# Patient Record
Sex: Female | Born: 1965 | Race: White | Hispanic: No | Marital: Married | Smoking: Never smoker
Health system: Southern US, Community
[De-identification: ages and names within clinical notes are randomized; demographics above are authoritative.]

## PROBLEM LIST (undated history)

## (undated) DIAGNOSIS — F4310 Post-traumatic stress disorder, unspecified: Secondary | ICD-10-CM

## (undated) DIAGNOSIS — D649 Anemia, unspecified: Secondary | ICD-10-CM

## (undated) DIAGNOSIS — I10 Essential (primary) hypertension: Secondary | ICD-10-CM

## (undated) DIAGNOSIS — M1990 Unspecified osteoarthritis, unspecified site: Secondary | ICD-10-CM

## (undated) DIAGNOSIS — F419 Anxiety disorder, unspecified: Secondary | ICD-10-CM

## (undated) DIAGNOSIS — J449 Chronic obstructive pulmonary disease, unspecified: Secondary | ICD-10-CM

## (undated) DIAGNOSIS — I739 Peripheral vascular disease, unspecified: Secondary | ICD-10-CM

## (undated) DIAGNOSIS — M549 Dorsalgia, unspecified: Secondary | ICD-10-CM

## (undated) DIAGNOSIS — K589 Irritable bowel syndrome without diarrhea: Secondary | ICD-10-CM

## (undated) DIAGNOSIS — F329 Major depressive disorder, single episode, unspecified: Secondary | ICD-10-CM

## (undated) DIAGNOSIS — F32A Depression, unspecified: Secondary | ICD-10-CM

## (undated) DIAGNOSIS — J45909 Unspecified asthma, uncomplicated: Secondary | ICD-10-CM

## (undated) DIAGNOSIS — R51 Headache: Secondary | ICD-10-CM

## (undated) DIAGNOSIS — Z87442 Personal history of urinary calculi: Secondary | ICD-10-CM

## (undated) DIAGNOSIS — K219 Gastro-esophageal reflux disease without esophagitis: Secondary | ICD-10-CM

## (undated) DIAGNOSIS — Z86718 Personal history of other venous thrombosis and embolism: Secondary | ICD-10-CM

## (undated) DIAGNOSIS — R519 Headache, unspecified: Secondary | ICD-10-CM

## (undated) DIAGNOSIS — Z8719 Personal history of other diseases of the digestive system: Secondary | ICD-10-CM

## (undated) HISTORY — PX: TONSILLECTOMY: SUR1361

## (undated) HISTORY — PX: CHOLECYSTECTOMY: SHX55

## (undated) HISTORY — PX: DILATION AND CURETTAGE OF UTERUS: SHX78

## (undated) HISTORY — DX: Personal history of other venous thrombosis and embolism: Z86.718

## (undated) HISTORY — DX: Post-traumatic stress disorder, unspecified: F43.10

## (undated) HISTORY — PX: NECK SURGERY: SHX720

---

## 1998-12-15 ENCOUNTER — Ambulatory Visit (HOSPITAL_COMMUNITY): Admission: RE | Admit: 1998-12-15 | Discharge: 1998-12-15 | Payer: Self-pay | Admitting: Obstetrics and Gynecology

## 2007-06-12 ENCOUNTER — Emergency Department: Payer: Self-pay | Admitting: Emergency Medicine

## 2008-01-15 ENCOUNTER — Ambulatory Visit: Payer: Self-pay | Admitting: Gastroenterology

## 2008-01-27 ENCOUNTER — Ambulatory Visit: Payer: Self-pay | Admitting: Gastroenterology

## 2009-03-26 ENCOUNTER — Emergency Department: Payer: Self-pay | Admitting: Emergency Medicine

## 2010-04-01 ENCOUNTER — Ambulatory Visit: Payer: Self-pay | Admitting: Obstetrics and Gynecology

## 2011-02-17 ENCOUNTER — Ambulatory Visit: Payer: Self-pay | Admitting: Obstetrics and Gynecology

## 2011-02-20 LAB — PATHOLOGY REPORT

## 2014-02-11 ENCOUNTER — Inpatient Hospital Stay (HOSPITAL_COMMUNITY): Payer: 59

## 2014-02-11 ENCOUNTER — Encounter (HOSPITAL_COMMUNITY): Payer: Self-pay | Admitting: Emergency Medicine

## 2014-02-11 ENCOUNTER — Emergency Department: Payer: Self-pay | Admitting: Emergency Medicine

## 2014-02-11 ENCOUNTER — Inpatient Hospital Stay (HOSPITAL_COMMUNITY)
Admission: EM | Admit: 2014-02-11 | Discharge: 2014-02-14 | DRG: 552 | Disposition: A | Discharge status: Home / Self Care | Payer: 59 | Attending: General Surgery | Admitting: General Surgery

## 2014-02-11 DIAGNOSIS — G894 Chronic pain syndrome: Secondary | ICD-10-CM | POA: Diagnosis present

## 2014-02-11 DIAGNOSIS — S060X9A Concussion with loss of consciousness of unspecified duration, initial encounter: Secondary | ICD-10-CM | POA: Diagnosis present

## 2014-02-11 DIAGNOSIS — F329 Major depressive disorder, single episode, unspecified: Secondary | ICD-10-CM | POA: Diagnosis present

## 2014-02-11 DIAGNOSIS — Z7982 Long term (current) use of aspirin: Secondary | ICD-10-CM

## 2014-02-11 DIAGNOSIS — R109 Unspecified abdominal pain: Secondary | ICD-10-CM | POA: Diagnosis present

## 2014-02-11 DIAGNOSIS — S129XXA Fracture of neck, unspecified, initial encounter: Secondary | ICD-10-CM

## 2014-02-11 DIAGNOSIS — Z1881 Retained glass fragments: Secondary | ICD-10-CM

## 2014-02-11 DIAGNOSIS — K5900 Constipation, unspecified: Secondary | ICD-10-CM | POA: Diagnosis present

## 2014-02-11 DIAGNOSIS — M545 Low back pain, unspecified: Secondary | ICD-10-CM | POA: Diagnosis present

## 2014-02-11 DIAGNOSIS — I10 Essential (primary) hypertension: Secondary | ICD-10-CM | POA: Diagnosis present

## 2014-02-11 DIAGNOSIS — J4489 Other specified chronic obstructive pulmonary disease: Secondary | ICD-10-CM | POA: Diagnosis present

## 2014-02-11 DIAGNOSIS — F3289 Other specified depressive episodes: Secondary | ICD-10-CM | POA: Diagnosis present

## 2014-02-11 DIAGNOSIS — D62 Acute posthemorrhagic anemia: Secondary | ICD-10-CM | POA: Diagnosis present

## 2014-02-11 DIAGNOSIS — R071 Chest pain on breathing: Secondary | ICD-10-CM | POA: Diagnosis present

## 2014-02-11 DIAGNOSIS — J449 Chronic obstructive pulmonary disease, unspecified: Secondary | ICD-10-CM | POA: Diagnosis present

## 2014-02-11 DIAGNOSIS — Z79899 Other long term (current) drug therapy: Secondary | ICD-10-CM

## 2014-02-11 DIAGNOSIS — S61209A Unspecified open wound of unspecified finger without damage to nail, initial encounter: Secondary | ICD-10-CM | POA: Diagnosis present

## 2014-02-11 DIAGNOSIS — G8911 Acute pain due to trauma: Secondary | ICD-10-CM

## 2014-02-11 DIAGNOSIS — G8929 Other chronic pain: Secondary | ICD-10-CM | POA: Diagnosis present

## 2014-02-11 DIAGNOSIS — S060XAA Concussion with loss of consciousness status unknown, initial encounter: Secondary | ICD-10-CM | POA: Diagnosis present

## 2014-02-11 DIAGNOSIS — Z86718 Personal history of other venous thrombosis and embolism: Secondary | ICD-10-CM

## 2014-02-11 DIAGNOSIS — S12000A Unspecified displaced fracture of first cervical vertebra, initial encounter for closed fracture: Principal | ICD-10-CM | POA: Diagnosis present

## 2014-02-11 HISTORY — DX: Major depressive disorder, single episode, unspecified: F32.9

## 2014-02-11 HISTORY — DX: Depression, unspecified: F32.A

## 2014-02-11 HISTORY — DX: Dorsalgia, unspecified: M54.9

## 2014-02-11 HISTORY — DX: Unspecified asthma, uncomplicated: J45.909

## 2014-02-11 HISTORY — DX: Essential (primary) hypertension: I10

## 2014-02-11 HISTORY — DX: Chronic obstructive pulmonary disease, unspecified: J44.9

## 2014-02-11 LAB — PROTIME-INR
INR: 1
PROTHROMBIN TIME: 13.1 s (ref 11.5–14.7)

## 2014-02-11 LAB — COMPREHENSIVE METABOLIC PANEL
Albumin: 3.3 g/dL — ABNORMAL LOW (ref 3.4–5.0)
Alkaline Phosphatase: 64 U/L
Anion Gap: 2 — ABNORMAL LOW (ref 7–16)
BUN: 10 mg/dL (ref 7–18)
Bilirubin,Total: 0.3 mg/dL (ref 0.2–1.0)
CHLORIDE: 107 mmol/L (ref 98–107)
CO2: 30 mmol/L (ref 21–32)
Calcium, Total: 8.7 mg/dL (ref 8.5–10.1)
Creatinine: 0.8 mg/dL (ref 0.60–1.30)
EGFR (African American): >60
Glucose: 91 mg/dL (ref 65–99)
Osmolality: 276 (ref 275–301)
POTASSIUM: 3.7 mmol/L (ref 3.5–5.1)
SGOT(AST): 33 U/L (ref 15–37)
SGPT (ALT): 25 U/L (ref 12–78)
Sodium: 139 mmol/L (ref 136–145)
Total Protein: 6.8 g/dL (ref 6.4–8.2)

## 2014-02-11 LAB — CBC
HCT: 37.6 % (ref 35.0–47.0)
HGB: 12.9 g/dL (ref 12.0–16.0)
MCH: 33 pg (ref 26.0–34.0)
MCHC: 34.3 g/dL (ref 32.0–36.0)
MCV: 96 fL (ref 80–100)
Platelet: 302 10*3/uL (ref 150–440)
RBC: 3.91 10*6/uL (ref 3.80–5.20)
RDW: 13.1 % (ref 11.5–14.5)
WBC: 21.5 10*3/uL — AB (ref 3.6–11.0)

## 2014-02-11 MED ORDER — ENOXAPARIN SODIUM 40 MG/0.4ML ~~LOC~~ SOLN
40.0000 mg | SUBCUTANEOUS | Status: DC
  Filled 2014-02-11: qty 0.4

## 2014-02-11 MED ORDER — ALPRAZOLAM 0.5 MG PO TABS
0.5000 mg | ORAL_TABLET | Freq: Two times a day (BID) | ORAL | Status: DC | PRN
  Administered 2014-02-13 – 2014-02-14 (×2): 0.5 mg via ORAL
  Filled 2014-02-11 (×2): qty 1

## 2014-02-11 MED ORDER — DULOXETINE HCL 60 MG PO CPEP: 120.0000 mg | ORAL_CAPSULE | Freq: Two times a day (BID) | ORAL | Status: DC

## 2014-02-11 MED ORDER — HYDROMORPHONE HCL PF 1 MG/ML IJ SOLN
INTRAMUSCULAR | Status: AC
  Filled 2014-02-11: qty 1

## 2014-02-11 MED ORDER — DEXTROSE-NACL 5-0.9 % IV SOLN
INTRAVENOUS | Status: DC
  Administered 2014-02-11: 18:00:00 via INTRAVENOUS

## 2014-02-11 MED ORDER — DULOXETINE HCL 60 MG PO CPEP
60.0000 mg | ORAL_CAPSULE | Freq: Two times a day (BID) | ORAL | Status: DC
  Administered 2014-02-11 – 2014-02-14 (×6): 60 mg via ORAL
  Filled 2014-02-11 (×7): qty 1
  Filled 2014-02-11: qty 2
  Filled 2014-02-11: qty 1

## 2014-02-11 MED ORDER — POTASSIUM CHLORIDE CRYS ER 20 MEQ PO TBCR
10.0000 meq | EXTENDED_RELEASE_TABLET | Freq: Every day | ORAL | Status: DC
  Filled 2014-02-11: qty 0.5
  Filled 2014-02-11: qty 1

## 2014-02-11 MED ORDER — BISACODYL 10 MG RE SUPP: 10.0000 mg | Freq: Every day | RECTAL | Status: DC | PRN

## 2014-02-11 MED ORDER — BACITRACIN-NEOMYCIN-POLYMYXIN OINTMENT TUBE
1.0000 "application " | TOPICAL_OINTMENT | Freq: Two times a day (BID) | CUTANEOUS | Status: DC
  Administered 2014-02-11 – 2014-02-14 (×6): 1 via TOPICAL
  Filled 2014-02-11 (×2): qty 15

## 2014-02-11 MED ORDER — DOCUSATE SODIUM 100 MG PO CAPS
100.0000 mg | ORAL_CAPSULE | Freq: Two times a day (BID) | ORAL | Status: DC
  Administered 2014-02-11 – 2014-02-14 (×6): 100 mg via ORAL
  Filled 2014-02-11 (×6): qty 1

## 2014-02-11 MED ORDER — FUROSEMIDE 20 MG PO TABS
80.0000 mg | ORAL_TABLET | Freq: Every day | ORAL | Status: DC
Start: 2014-02-11 — End: 2014-02-14
  Administered 2014-02-11 – 2014-02-14 (×4): 80 mg via ORAL
  Filled 2014-02-11 (×4): qty 4

## 2014-02-11 MED ORDER — OXYCODONE HCL 5 MG PO TABS: 5.0000 mg | ORAL_TABLET | ORAL | Status: DC | PRN

## 2014-02-11 MED ORDER — ONDANSETRON HCL 4 MG/2ML IJ SOLN
INTRAMUSCULAR | Status: AC
  Filled 2014-02-11: qty 2

## 2014-02-11 MED ORDER — ACETAMINOPHEN 325 MG PO TABS
650.0000 mg | ORAL_TABLET | ORAL | Status: DC | PRN
  Administered 2014-02-12 – 2014-02-13 (×2): 650 mg via ORAL
  Filled 2014-02-11 (×2): qty 2

## 2014-02-11 MED ORDER — HYDROMORPHONE HCL PF 1 MG/ML IJ SOLN
0.5000 mg | INTRAMUSCULAR | Status: DC | PRN
  Administered 2014-02-11 – 2014-02-12 (×8): 1 mg via INTRAVENOUS
  Filled 2014-02-11 (×7): qty 1

## 2014-02-11 MED ORDER — LORATADINE 10 MG PO TABS
10.0000 mg | ORAL_TABLET | Freq: Every day | ORAL | Status: DC
  Administered 2014-02-11 – 2014-02-14 (×4): 10 mg via ORAL
  Filled 2014-02-11 (×4): qty 1

## 2014-02-11 MED ORDER — ONDANSETRON HCL 4 MG PO TABS: 4.0000 mg | ORAL_TABLET | Freq: Four times a day (QID) | ORAL | Status: DC | PRN

## 2014-02-11 MED ORDER — OXYCODONE HCL 5 MG PO TABS
10.0000 mg | ORAL_TABLET | ORAL | Status: DC | PRN
  Administered 2014-02-11 – 2014-02-12 (×2): 10 mg via ORAL
  Filled 2014-02-11 (×2): qty 2

## 2014-02-11 MED ORDER — IOHEXOL 300 MG/ML  SOLN
100.0000 mL | Freq: Once | INTRAMUSCULAR | Status: AC | PRN
  Administered 2014-02-11: 100 mL via INTRAVENOUS

## 2014-02-11 MED ORDER — GABAPENTIN 400 MG PO CAPS
400.0000 mg | ORAL_CAPSULE | Freq: Every day | ORAL | Status: DC
  Filled 2014-02-11: qty 1

## 2014-02-11 MED ORDER — POTASSIUM CHLORIDE CRYS ER 10 MEQ PO TBCR
10.0000 meq | EXTENDED_RELEASE_TABLET | Freq: Every day | ORAL | Status: DC
  Administered 2014-02-11 – 2014-02-14 (×4): 10 meq via ORAL
  Filled 2014-02-11 (×3): qty 1

## 2014-02-11 MED ORDER — ONDANSETRON HCL 4 MG/2ML IJ SOLN: 4.0000 mg | Freq: Four times a day (QID) | INTRAMUSCULAR | Status: DC | PRN
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
## 2014-02-11 NOTE — ED Notes (Signed)
Just received pt. She is being  Sent to X-ray and CT scan
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
## 2014-02-11 NOTE — ED Notes (Addendum)
Returned from CT scan with Lawson FiscalLori, RN, C-spines maintained throughout the transfers , GCS 15, Pt. Stable upon transfer to Room,6N15, accompanied by family

## 2014-02-12 DIAGNOSIS — J45909 Unspecified asthma, uncomplicated: Secondary | ICD-10-CM | POA: Insufficient documentation

## 2014-02-12 DIAGNOSIS — J449 Chronic obstructive pulmonary disease, unspecified: Secondary | ICD-10-CM | POA: Insufficient documentation

## 2014-02-12 DIAGNOSIS — D62 Acute posthemorrhagic anemia: Secondary | ICD-10-CM

## 2014-02-12 DIAGNOSIS — F32A Depression, unspecified: Secondary | ICD-10-CM | POA: Insufficient documentation

## 2014-02-12 DIAGNOSIS — F329 Major depressive disorder, single episode, unspecified: Secondary | ICD-10-CM | POA: Insufficient documentation

## 2014-02-12 DIAGNOSIS — S12000A Unspecified displaced fracture of first cervical vertebra, initial encounter for closed fracture: Secondary | ICD-10-CM | POA: Diagnosis present

## 2014-02-12 DIAGNOSIS — I10 Essential (primary) hypertension: Secondary | ICD-10-CM | POA: Insufficient documentation

## 2014-02-12 DIAGNOSIS — G894 Chronic pain syndrome: Secondary | ICD-10-CM | POA: Diagnosis present

## 2014-02-12 LAB — CBC
HCT: 33.5 % — ABNORMAL LOW (ref 36.0–46.0)
Hemoglobin: 11.3 g/dL — ABNORMAL LOW (ref 12.0–15.0)
MCH: 32.9 pg (ref 26.0–34.0)
MCHC: 33.7 g/dL (ref 30.0–36.0)
MCV: 97.7 fL (ref 78.0–100.0)
PLATELETS: 293 10*3/uL (ref 150–400)
RBC: 3.43 MIL/uL — ABNORMAL LOW (ref 3.87–5.11)
RDW: 13.5 % (ref 11.5–15.5)
WBC: 10 10*3/uL (ref 4.0–10.5)

## 2014-02-12 LAB — BASIC METABOLIC PANEL
BUN: 7 mg/dL (ref 6–23)
CALCIUM: 7.6 mg/dL — AB (ref 8.4–10.5)
CO2: 28 mEq/L (ref 19–32)
CREATININE: 0.78 mg/dL (ref 0.50–1.10)
Chloride: 102 mEq/L (ref 96–112)
GFR calc non Af Amer: >90 mL/min (ref >90)
Glucose, Bld: 118 mg/dL — ABNORMAL HIGH (ref 70–99)
Potassium: 3.5 mEq/L — ABNORMAL LOW (ref 3.7–5.3)
SODIUM: 140 meq/L (ref 137–147)

## 2014-02-12 MED ORDER — OXYCODONE HCL 5 MG PO TABS
5.0000 mg | ORAL_TABLET | ORAL | Status: DC | PRN
  Administered 2014-02-12: 15 mg via ORAL
  Administered 2014-02-12: 5 mg via ORAL
  Administered 2014-02-12 (×2): 10 mg via ORAL
  Administered 2014-02-13: 15 mg via ORAL
  Filled 2014-02-12 (×2): qty 2
  Filled 2014-02-12 (×2): qty 3
  Filled 2014-02-12: qty 2
  Filled 2014-02-12: qty 3

## 2014-02-12 MED ORDER — NAPROXEN 500 MG PO TABS
500.0000 mg | ORAL_TABLET | Freq: Two times a day (BID) | ORAL | Status: DC
  Administered 2014-02-12 – 2014-02-14 (×4): 500 mg via ORAL
  Filled 2014-02-12 (×6): qty 1

## 2014-02-12 MED ORDER — HYDROMORPHONE HCL PF 1 MG/ML IJ SOLN
0.5000 mg | INTRAMUSCULAR | Status: DC | PRN
  Administered 2014-02-12 – 2014-02-13 (×4): 0.5 mg via INTRAVENOUS
  Filled 2014-02-12 (×4): qty 1

## 2014-02-12 MED ORDER — ENOXAPARIN SODIUM 30 MG/0.3ML ~~LOC~~ SOLN
30.0000 mg | Freq: Two times a day (BID) | SUBCUTANEOUS | Status: DC
  Administered 2014-02-12 – 2014-02-14 (×5): 30 mg via SUBCUTANEOUS
  Filled 2014-02-12 (×6): qty 0.3
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
## 2014-02-12 NOTE — Progress Notes (Signed)
UR completed.  Aizen Duval, RN BSN MHA CCM Trauma/Neuro ICU Case Manager [phone]  

## 2014-02-12 NOTE — Progress Notes (Signed)
I also spoke with her husband. Patient examined and I agree with the assessment and plan  Violeta GelinasBurke Akima Slaugh, MD, MPH, FACS Pager: [phone]  02/12/2014 11:19 AM

## 2014-02-13 DIAGNOSIS — S060XAA Concussion with loss of consciousness status unknown, initial encounter: Secondary | ICD-10-CM | POA: Diagnosis present

## 2014-02-13 DIAGNOSIS — S060X9A Concussion with loss of consciousness of unspecified duration, initial encounter: Secondary | ICD-10-CM | POA: Diagnosis present

## 2014-02-13 MED ORDER — HYDROMORPHONE HCL PF 1 MG/ML IJ SOLN
0.5000 mg | INTRAMUSCULAR | Status: DC | PRN
  Administered 2014-02-13 – 2014-02-14 (×4): 1 mg via INTRAVENOUS
  Filled 2014-02-13 (×4): qty 1

## 2014-02-13 MED ORDER — OXYCODONE HCL 5 MG PO TABS
5.0000 mg | ORAL_TABLET | ORAL | Status: DC | PRN
  Administered 2014-02-13 – 2014-02-14 (×7): 20 mg via ORAL
  Filled 2014-02-13 (×7): qty 4

## 2014-02-13 MED ORDER — TRAMADOL HCL 50 MG PO TABS
100.0000 mg | ORAL_TABLET | Freq: Four times a day (QID) | ORAL | Status: DC
  Administered 2014-02-13 – 2014-02-14 (×6): 100 mg via ORAL
  Filled 2014-02-13 (×6): qty 2
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
## 2014-02-13 NOTE — Progress Notes (Signed)
Seen and agree  
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
## 2014-02-14 MED ORDER — DSS 100 MG PO CAPS: 100.0000 mg | ORAL_CAPSULE | Freq: Two times a day (BID) | ORAL | Status: DC

## 2014-02-14 MED ORDER — NAPROXEN 500 MG PO TABS: 500.0000 mg | ORAL_TABLET | Freq: Two times a day (BID) | ORAL | Status: DC

## 2014-02-14 MED ORDER — OXYCODONE HCL ER 10 MG PO T12A: 10.0000 mg | EXTENDED_RELEASE_TABLET | Freq: Two times a day (BID) | ORAL | Status: DC

## 2014-02-14 MED ORDER — POLYETHYLENE GLYCOL 3350 17 G PO PACK
17.0000 g | PACK | Freq: Once | ORAL | Status: AC
Start: 2014-02-14 — End: 2014-02-14
  Administered 2014-02-14: 17 g via ORAL
  Filled 2014-02-14: qty 1

## 2014-02-14 MED ORDER — OXYCODONE-ACETAMINOPHEN 10-325 MG PO TABS: 1.0000 | ORAL_TABLET | ORAL | Status: DC | PRN

## 2014-02-14 MED ORDER — TRAMADOL HCL 50 MG PO TABS: 100.0000 mg | ORAL_TABLET | Freq: Four times a day (QID) | ORAL | Status: DC

## 2014-02-14 NOTE — Progress Notes (Signed)
Discharge instructions gone over with patient and spouse. Prescriptions given. Home medications gone over. Diet, activity, and neck precautions gone over. Signs and symptoms of infection, bowel regimen, and wound care gone over. Follow up appointment with trauma made, follow up with neurosurgery to be made. [facility] following patient for physical and occupational therapy. Patient verbalized understanding of instructions and aspen collar is on at discharge.

## 2014-02-14 NOTE — Discharge Instructions (Signed)
Keep collar on at all times. ° °No driving while taking oxycodone. °
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
## 2014-02-14 NOTE — Discharge Summary (Signed)
Agree with discharge summary and discharge plans.  Angelia MouldHaywood M. Derrell LollingIngram, M.D., [facility] General and Minimally invasive Surgery Breast and Colorectal Surgery Office:   [phone] Pager:   [phone]
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
## 2014-02-20 ENCOUNTER — Telehealth (HOSPITAL_COMMUNITY): Payer: Self-pay | Admitting: Lab

## 2014-02-20 MED ORDER — OXYCODONE-ACETAMINOPHEN 10-325 MG PO TABS: 1.0000 | ORAL_TABLET | ORAL | Status: DC | PRN

## 2014-02-20 NOTE — Telephone Encounter (Signed)
Addressed in other encounter 

## 2014-02-20 NOTE — Telephone Encounter (Signed)
Told husband that pt should stop Oxycontin after her last pill tonight. Refilled Perc 10, #60 and told patient that further rx's would have to come from [facility]. Will cancel appt with us for 3/4 as this was only thing we were seeing her for. Husband expressed understanding and gratitude.

## 2014-02-24 ENCOUNTER — Telehealth (INDEPENDENT_AMBULATORY_CARE_PROVIDER_SITE_OTHER): Payer: Self-pay | Admitting: *Deleted

## 2014-02-24 NOTE — Telephone Encounter (Signed)
Called pt per Melinda Harrison to verify that she understands that we will not be prescribing narcotics and to make sure she is aware to follow up with a [facility].Marland Kitchen.  Spoke to pt and she is completely aware and advised me to cancel the appt for 02/25/14 with Trauma.Melinda Harrison. Melinda Harrison notified.Marland Kitchen.jkw

## 2014-02-25 ENCOUNTER — Encounter (INDEPENDENT_AMBULATORY_CARE_PROVIDER_SITE_OTHER): Payer: 59

## 2014-02-25 ENCOUNTER — Telehealth (HOSPITAL_COMMUNITY): Payer: Self-pay | Admitting: Lab

## 2014-02-26 NOTE — Telephone Encounter (Signed)
Noted  

## 2014-03-04 ENCOUNTER — Telehealth (INDEPENDENT_AMBULATORY_CARE_PROVIDER_SITE_OTHER): Payer: Self-pay

## 2014-03-04 NOTE — Telephone Encounter (Signed)
[facility] to let Dr Maisie Fushomas know patient is refusing any further services from them.

## 2014-03-05 NOTE — Telephone Encounter (Signed)
This is not my pt

## 2014-03-10 ENCOUNTER — Telehealth (INDEPENDENT_AMBULATORY_CARE_PROVIDER_SITE_OTHER): Payer: Self-pay

## 2014-03-10 NOTE — Telephone Encounter (Signed)
[facility] PT calling for VO for one visit per week x 2 wks starting 03/09/14.  Verbal order given and Dr. Janee Mornhompson made aware.

## 2014-04-29 ENCOUNTER — Ambulatory Visit: Payer: Self-pay | Admitting: Orthopedic Surgery

## 2014-04-29 DIAGNOSIS — S60559A Superficial foreign body of unspecified hand, initial encounter: Secondary | ICD-10-CM | POA: Insufficient documentation

## 2014-04-29 DIAGNOSIS — M795 Residual foreign body in soft tissue: Secondary | ICD-10-CM | POA: Insufficient documentation

## 2014-05-05 ENCOUNTER — Ambulatory Visit: Payer: Self-pay | Admitting: Orthopedic Surgery

## 2014-05-29 DIAGNOSIS — Z87828 Personal history of other (healed) physical injury and trauma: Secondary | ICD-10-CM | POA: Insufficient documentation

## 2014-06-02 ENCOUNTER — Telehealth (HOSPITAL_COMMUNITY): Payer: Self-pay

## 2014-06-02 NOTE — Telephone Encounter (Signed)
Consider calling [phone] beh psych for names of providers within the community.  I relayed this to Chanel with Dr. Sueanne Margarita office.

## 2014-06-18 NOTE — Telephone Encounter (Signed)
Patient called for help with symptoms of ASR. Will mail resource packet to patient.

## 2015-03-31 ENCOUNTER — Other Ambulatory Visit: Payer: Self-pay

## 2015-03-31 DIAGNOSIS — F331 Major depressive disorder, recurrent, moderate: Secondary | ICD-10-CM | POA: Insufficient documentation

## 2015-03-31 DIAGNOSIS — Z87898 Personal history of other specified conditions: Secondary | ICD-10-CM | POA: Insufficient documentation

## 2015-03-31 DIAGNOSIS — Z8679 Personal history of other diseases of the circulatory system: Secondary | ICD-10-CM | POA: Insufficient documentation

## 2015-03-31 DIAGNOSIS — Z8639 Personal history of other endocrine, nutritional and metabolic disease: Secondary | ICD-10-CM | POA: Insufficient documentation

## 2015-03-31 DIAGNOSIS — Z8768 Personal history of other (corrected) conditions arising in the perinatal period: Secondary | ICD-10-CM | POA: Insufficient documentation
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
## 2015-04-23 ENCOUNTER — Emergency Department
Admit: 2015-04-23 | Disposition: A | Discharge status: Home / Self Care | Payer: Self-pay | Admitting: Emergency Medicine

## 2015-04-23 LAB — CBC WITH DIFFERENTIAL/PLATELET
BASOS ABS: 0.1 10*3/uL (ref 0.0–0.1)
Basophil %: 0.5 %
EOS ABS: 0.2 10*3/uL (ref 0.0–0.7)
Eosinophil %: 1.5 %
HCT: 41 % (ref 35.0–47.0)
HGB: 14 g/dL (ref 12.0–16.0)
LYMPHS ABS: 3 10*3/uL (ref 1.0–3.6)
LYMPHS PCT: 23.7 %
MCH: 32.3 pg (ref 26.0–34.0)
MCHC: 34.1 g/dL (ref 32.0–36.0)
MCV: 95 fL (ref 80–100)
MONOS PCT: 9.5 %
Monocyte #: 1.2 x10 3/mm — ABNORMAL HIGH (ref 0.2–0.9)
NEUTROS ABS: 8.2 10*3/uL — AB (ref 1.4–6.5)
NEUTROS PCT: 64.8 %
PLATELETS: 301 10*3/uL (ref 150–440)
RBC: 4.34 10*6/uL (ref 3.80–5.20)
RDW: 13.2 % (ref 11.5–14.5)
WBC: 12.7 10*3/uL — ABNORMAL HIGH (ref 3.6–11.0)

## 2015-04-23 LAB — BASIC METABOLIC PANEL
ANION GAP: 11 (ref 7–16)
BUN: 17 mg/dL
CALCIUM: 8.9 mg/dL
CHLORIDE: 97 mmol/L — AB
Co2: 26 mmol/L
Creatinine: 0.89 mg/dL
EGFR (African American): >60
GLUCOSE: 122 mg/dL — AB
Potassium: 3.1 mmol/L — ABNORMAL LOW
Sodium: 134 mmol/L — ABNORMAL LOW

## 2015-04-23 LAB — TROPONIN I: Troponin-I: <0.03 ng/mL

## 2015-05-11 ENCOUNTER — Emergency Department
Admission: EM | Admit: 2015-05-11 | Discharge: 2015-05-11 | Disposition: A | Discharge status: Home / Self Care | Payer: BLUE CROSS/BLUE SHIELD | Attending: Internal Medicine | Admitting: Internal Medicine

## 2015-05-11 DIAGNOSIS — Z79899 Other long term (current) drug therapy: Secondary | ICD-10-CM | POA: Insufficient documentation

## 2015-05-11 DIAGNOSIS — Z791 Long term (current) use of non-steroidal anti-inflammatories (NSAID): Secondary | ICD-10-CM | POA: Insufficient documentation

## 2015-05-11 DIAGNOSIS — Z79891 Long term (current) use of opiate analgesic: Secondary | ICD-10-CM | POA: Diagnosis not present

## 2015-05-11 DIAGNOSIS — Z4802 Encounter for removal of sutures: Secondary | ICD-10-CM

## 2015-05-11 DIAGNOSIS — I10 Essential (primary) hypertension: Secondary | ICD-10-CM | POA: Diagnosis not present
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
## 2015-05-11 NOTE — Discharge Instructions (Signed)
Keep area clean. Follow-up with your primary is care physician as needed. Return to the ER for new or worsening concerns.  Staple Removal, Care After The staples that were used to close your skin have been removed. The care described here will need to continue until the wound is completely healed and your health care provider confirms that wound care can be stopped. HOME CARE INSTRUCTIONS   Keep the wound site dry and clean. Do not soak it in water.  If skin adhesive strips were applied after the staples were removed, they will begin to peel off in a few days. Allow them to remain in place until they fall off on their own.  If you still have a bandage (dressing), change it at least once a day or as directed by your health care provider. If the dressing sticks, pour warm, sterile water over it until it loosens and can be removed without pulling apart the wound edges. Pat dry with a clean towel.  Apply cream or ointment that stops the growth of bacteria (antibacterial cream or ointment) only if your health care provider has directed you to do so. Place a nonstick bandage over the wound to prevent the dressing from sticking.  Cover the nonstick bandage with a new dressing as directed by your health care provider.  If the bandage becomes wet, dirty, or develops a bad smell, change it as soon as possible.  New scars become sunburned easily. Use sunscreens with a sun protection factor (SPF) of at least 15 when out in the sun. Reapply the SPF every 2 hours.  Only take medicines as directed by your health care provider. SEEK IMMEDIATE MEDICAL CARE IF:   You have redness, swelling, or increasing pain in the wound.  You have pus coming from the wound.  You have a fever.  You notice a bad smell coming from the wound or dressing.  Your wound edges open up after staples have been removed. MAKE SURE YOU:   Understand these instructions.  Will watch your condition.  Will get help right away if  you are not doing well or get worse. Document Released: 11/23/2008 Document Revised: 12/16/2013 Document Reviewed: 11/23/2008 [facility] Patient Information 2015 [employer] [place]. This information is not intended to replace advice given to you by your health care provider. Make sure you discuss any questions you have with your health care provider.

## 2015-07-01 ENCOUNTER — Other Ambulatory Visit: Payer: Self-pay | Admitting: Bariatrics

## 2015-07-02 ENCOUNTER — Other Ambulatory Visit: Payer: Self-pay | Admitting: Bariatrics

## 2015-07-09 ENCOUNTER — Ambulatory Visit: Admission: RE | Admit: 2015-07-09 | Payer: BLUE CROSS/BLUE SHIELD | Source: Ambulatory Visit

## 2015-07-09 ENCOUNTER — Other Ambulatory Visit: Payer: BLUE CROSS/BLUE SHIELD

## 2015-07-09 ENCOUNTER — Ambulatory Visit: Payer: BLUE CROSS/BLUE SHIELD | Attending: Family Medicine

## 2015-07-20 ENCOUNTER — Ambulatory Visit: Payer: BLUE CROSS/BLUE SHIELD

## 2015-07-20 ENCOUNTER — Other Ambulatory Visit: Payer: BLUE CROSS/BLUE SHIELD

## 2015-07-20 ENCOUNTER — Ambulatory Visit: Admission: RE | Admit: 2015-07-20 | Payer: BLUE CROSS/BLUE SHIELD | Source: Ambulatory Visit

## 2015-07-23 ENCOUNTER — Ambulatory Visit
Admission: RE | Admit: 2015-07-23 | Discharge: 2015-07-23 | Disposition: A | Discharge status: Home / Self Care | Payer: BLUE CROSS/BLUE SHIELD | Source: Ambulatory Visit | Attending: Bariatrics | Admitting: Bariatrics

## 2015-07-23 ENCOUNTER — Other Ambulatory Visit: Payer: Self-pay | Admitting: Bariatrics

## 2015-07-23 DIAGNOSIS — Z01810 Encounter for preprocedural cardiovascular examination: Secondary | ICD-10-CM | POA: Insufficient documentation

## 2015-07-23 DIAGNOSIS — Z01818 Encounter for other preprocedural examination: Secondary | ICD-10-CM | POA: Diagnosis present

## 2015-08-02 ENCOUNTER — Ambulatory Visit: Payer: BLUE CROSS/BLUE SHIELD | Attending: Otolaryngology

## 2015-08-02 DIAGNOSIS — F5101 Primary insomnia: Secondary | ICD-10-CM | POA: Insufficient documentation

## 2015-08-02 DIAGNOSIS — G4733 Obstructive sleep apnea (adult) (pediatric): Secondary | ICD-10-CM | POA: Diagnosis present

## 2015-08-04 DIAGNOSIS — E559 Vitamin D deficiency, unspecified: Secondary | ICD-10-CM | POA: Insufficient documentation

## 2015-08-09 DIAGNOSIS — Z01810 Encounter for preprocedural cardiovascular examination: Secondary | ICD-10-CM | POA: Insufficient documentation

## 2015-08-09 DIAGNOSIS — R0681 Apnea, not elsewhere classified: Secondary | ICD-10-CM | POA: Insufficient documentation

## 2015-09-14 ENCOUNTER — Encounter: Payer: Self-pay | Admitting: Psychiatry

## 2015-09-14 ENCOUNTER — Ambulatory Visit (INDEPENDENT_AMBULATORY_CARE_PROVIDER_SITE_OTHER): Payer: BLUE CROSS/BLUE SHIELD | Admitting: Psychiatry

## 2015-09-14 VITALS — BP 132/86 | HR 94 | Temp 97.9°F | Ht 64.5 in | Wt 268.0 lb

## 2015-09-14 DIAGNOSIS — F331 Major depressive disorder, recurrent, moderate: Secondary | ICD-10-CM

## 2015-09-14 MED ORDER — QUETIAPINE FUMARATE ER 50 MG PO TB24: ORAL_TABLET | ORAL | Status: DC

## 2015-09-14 NOTE — Progress Notes (Signed)
BH MD/PA/NP OP Progress Note  09/14/2015 12:19 PM Melinda Harrison  MRN:  [id number]  Subjective:  Patient returns for follow-up of her major depressive disorder. He has not been followed up in the clinic since Archie 2016. She cites some of the impetus for making this appointment was that her disability carrier told her she needed to make follow-up appointments in mental health. She states that since she was last seen she has discontinued her Cymbalta. She states she also discontinued her Xanax as well as trazodone. At the last visit we had started some Wellbutrin that the patient states that never worked for her. She also states she had augmentation trials with Abilify which were not successful. She does feel some benefit from Cymbalta in regards to depression with pain. I discussed other options for augmentation and she elected to give a trial of Seroquel.  He cites that affording her medications can be a challenge. She indicates she still is able to enjoy a visits with relatives and nieces and nephews but otherwise she states she largely just stays in the house sitting in her down. She states her sleep has only been fair and she might sleep a few hours each night. Chief Complaint:  Chief Complaint    Follow-up; Medication Refill; Anxiety; Depression     Visit Diagnosis:  No diagnosis found.  Past Medical History:  Past Medical History  Diagnosis Date  . Hypertension   . COPD (chronic obstructive pulmonary disease)   . Depression   . Asthma   . Back pain   . PTSD (post-traumatic stress disorder)     Past Surgical History  Procedure Laterality Date  . Neck surgery     Family History:  Family History  Problem Relation Age of Onset  . Hypertension Mother   . Thyroid disease Mother   . Deep vein thrombosis Father   . Hypertension Father   . Depression Brother   . Hypertension Brother   . Depression Brother   . Hypertension Brother    Social History:  Social History   Social  History  . Marital Status: Married    Spouse Name: N/A  . Number of Children: N/A  . Years of Education: N/A   Social History Main Topics  . Smoking status: Never Smoker   . Smokeless tobacco: Never Used  . Alcohol Use: No  . Drug Use: No  . Sexual Activity: Yes    Birth Control/ Protection: None   Other Topics Concern  . None   Social History Narrative   Additional History:   Assessment:   Musculoskeletal: Strength & Muscle Tone: within normal limits Gait & Station: normal Patient leans: N/A  Psychiatric Specialty Exam: HPI  Review of Systems  Psychiatric/Behavioral: Positive for depression. Negative for suicidal ideas, hallucinations, memory loss and substance abuse. The patient has insomnia. The patient is not nervous/anxious.     Blood pressure 132/86, pulse 94, temperature 97.9 F (36.6 C), temperature source Tympanic, height 5' 4.5" (1.638 m), weight 268 lb (121.564 kg), last menstrual period 09/07/2015, SpO2 94 %.Body mass index is 45.31 kg/(m^2).  General Appearance: Fairly Groomed  Eye Contact:  Good  Speech:  Normal Rate  Volume:  Normal  Mood:  Not good  Affect:  Depressed and Tearful  Thought Process:  Linear  Orientation:  Full (Time, Place, and Person)  Thought Content:  Negative  Suicidal Thoughts:  No  Homicidal Thoughts:  No  Memory:  Immediate;   Good Recent;   Good  Remote;   Good  Judgement:  Good  Insight:  Good  Psychomotor Activity:  Negative  Concentration:  Good  Recall:  Good  Fund of Knowledge: Good  Language: Good  Akathisia:  Negative  Handed:  Right  AIMS (if indicated):  Not done today  Assets:  Desire for Improvement Social Support  ADL's:  Intact  Cognition: WNL  Sleep:  poor   Is the patient at risk to self?  No. Has the patient been a risk to self in the past 6 months?  No. Has the patient been a risk to self within the distant past?  No. Is the patient a risk to others?  No. Has the patient been a risk to others  in the past 6 months?  No. Has the patient been a risk to others within the distant past?  No.  Current Medications: Current Outpatient Prescriptions  Medication Sig Dispense Refill  . ALPRAZolam (XANAX) 0.5 MG tablet Take 0.5 mg by mouth 2 (two) times daily as needed for anxiety.    . cetirizine (ZYRTEC) 10 MG tablet Take 10 mg by mouth daily.    . DULoxetine (CYMBALTA) 60 MG capsule Take 1 capsule by mouth 2 (two) times daily.    Marland Kitchen EPINEPHrine (EPIPEN IJ) Inject 1 application as directed once as needed (for allergies).    Marland Kitchen estradiol (ESTRACE) 1 MG tablet Take 1 mg by mouth daily.    . fluticasone (FLONASE) 50 MCG/ACT nasal spray Place into the nose.    . furosemide (LASIX) 80 MG tablet Take 80 mg by mouth daily.     Marland Kitchen gabapentin (NEURONTIN) 400 MG capsule Take 400 mg by mouth daily.    . medroxyPROGESTERone (PROVERA) 2.5 MG tablet Take 2.5 mg by mouth daily.    . potassium chloride (K-DUR,KLOR-CON) 10 MEQ tablet Take 10 mEq by mouth daily.    . prazosin (MINIPRESS) 2 MG capsule Take 1 capsule by mouth at bedtime.    . pyridOXINE (VITAMIN B-6) 25 MG tablet Take by mouth.    . traZODone (DESYREL) 150 MG tablet Take 1 tablet by mouth 2 (two) times daily.    . QUEtiapine (SEROQUEL XR) 50 MG TB24 24 hr tablet Take one tablet at bedtime for seven days increase to three tablets at bedtime 90 each 1   No current facility-administered medications for this visit.    Medical Decision Making:  Established Problem, Worsening (2), Review of Medication Regimen & Side Effects (2) and Review of New Medication or Change in Dosage (2)  Treatment Plan Summary:Medication management and Plan At this time we'll continue her Cymbalta which is 60 mg twice daily. She indicates she still is taking the trazodone and the Ambien. She states her primary care is been writing for this and she is not sure whether she needs more of this or not. She will indicate she will call the clinic should she need refills of those  medications. I did discuss with her use of Seroquel XR for augmentation for depression. I prescribed her 50 mg a day for 1 week and then she will go to 150 mg at bedtime. Risk and benefits of been discussed and patient is able to consent. I provided patient with samples but also send in a prescription to her pharmacy.  Patient will follow up in 1 month. I've encouraged call any questions or concerns prior to her next apointment.  Major depressive disorder, recurrent-we'll continue her on Cymbalta as above. Will add Seroquel XR as noted  above.  Anxiety-Xanax 0.5 mg twice a day as needed.   Wallace Going 09/14/2015, 12:19 PM

## 2015-10-14 ENCOUNTER — Ambulatory Visit: Payer: Self-pay | Admitting: Psychiatry

## 2015-11-12 ENCOUNTER — Ambulatory Visit: Payer: BLUE CROSS/BLUE SHIELD | Admitting: Psychiatry

## 2015-11-17 ENCOUNTER — Encounter
Admission: RE | Admit: 2015-11-17 | Discharge: 2015-11-17 | Disposition: A | Discharge status: Home / Self Care | Payer: BLUE CROSS/BLUE SHIELD | Source: Ambulatory Visit | Attending: Bariatrics | Admitting: Bariatrics

## 2015-11-17 DIAGNOSIS — Z01812 Encounter for preprocedural laboratory examination: Secondary | ICD-10-CM | POA: Insufficient documentation

## 2015-11-17 HISTORY — DX: Headache, unspecified: R51.9

## 2015-11-17 HISTORY — DX: Irritable bowel syndrome, unspecified: K58.9

## 2015-11-17 HISTORY — DX: Anxiety disorder, unspecified: F41.9

## 2015-11-17 HISTORY — DX: Headache: R51

## 2015-11-17 HISTORY — DX: Anemia, unspecified: D64.9

## 2015-11-17 LAB — TYPE AND SCREEN
ABO/RH(D): A NEG
ANTIBODY SCREEN: NEGATIVE

## 2015-11-17 LAB — POTASSIUM: POTASSIUM: 3.5 mmol/L (ref 3.5–5.1)

## 2015-11-17 NOTE — Patient Instructions (Signed)
  Your procedure is scheduled on: November 23, 2015 (Tuesday) Report to Day Surgery.[facility]) Second Floor To find out your arrival time please call [phone] between 1PM - 3PM on November 22, 2015 (Monday).  Remember: Instructions that are not followed completely may result in serious medical risk, up to and including death, or upon the discretion of your surgeon and anesthesiologist your surgery may need to be rescheduled.    __x__ 1. Do not eat food or drink liquids after midnight. No gum chewing or hard candies.     __x__ 2. No Alcohol for 24 hours before or after surgery.   ____ 3. Bring all medications with you on the day of surgery if instructed.    __x__ 4. Notify your doctor if there is any change in your medical condition     (cold, fever, infections).     Do not wear jewelry, make-up, hairpins, clips or nail polish.  Do not wear lotions, powders, or perfumes. You may wear deodorant.  Do not shave 48 hours prior to surgery. Men may shave face and neck.  Do not bring valuables to the hospital.    [facility] any belongings or valuables.               Contacts, dentures or bridgework may not be worn into surgery.  Leave your suitcase in the car. After surgery it may be brought to your room.  For patients admitted to the hospital, discharge time is determined by your                treatment team.   Patients discharged the day of surgery will not be allowed to drive home.   Please read over the following fact sheets that you were given:   Surgical Site Infection Prevention   ____ Take these medicines the morning of surgery with A SIP OF WATER:    1. Cymbalta  2.   3.   4.  5.  6.  ____ Fleet Enema (as directed)   _x___ Use CHG Soap as directed  x____ Use inhalers on the day of surgery(USE ALBUTEROL AND PRO-AIR INHALERS THE DAY OF SURGERY AND  BRING TO THE HOSPITAL) ____ Stop metformin 2 days prior to surgery    ____ Take 1/2 of  usual insulin dose the night before surgery and none on the morning of surgery.   ____ Stop Coumadin/Plavix/aspirin on   ____ Stop Anti-inflammatories on (TYLENOL OK TO TAKE FOR PAIN IF NEEDED)   ____ Stop supplements until after surgery.    ____ Bring C-Pap to the hospital.

## 2015-11-18 LAB — ABO/RH: ABO/RH(D): A NEG

## 2015-11-23 ENCOUNTER — Inpatient Hospital Stay: Payer: BLUE CROSS/BLUE SHIELD | Admitting: Anesthesiology

## 2015-11-23 ENCOUNTER — Encounter: Payer: Self-pay | Admitting: *Deleted

## 2015-11-23 ENCOUNTER — Inpatient Hospital Stay
Admission: RE | Admit: 2015-11-23 | Discharge: 2015-11-25 | DRG: 621 | Disposition: A | Discharge status: Home / Self Care | Payer: BLUE CROSS/BLUE SHIELD | Source: Ambulatory Visit | Attending: Bariatrics | Admitting: Bariatrics

## 2015-11-23 ENCOUNTER — Encounter
Admission: RE | Disposition: A | Discharge status: Home / Self Care | Payer: Self-pay | Source: Ambulatory Visit | Attending: Bariatrics

## 2015-11-23 DIAGNOSIS — K219 Gastro-esophageal reflux disease without esophagitis: Secondary | ICD-10-CM | POA: Diagnosis present

## 2015-11-23 DIAGNOSIS — Z9884 Bariatric surgery status: Secondary | ICD-10-CM

## 2015-11-23 DIAGNOSIS — K449 Diaphragmatic hernia without obstruction or gangrene: Secondary | ICD-10-CM | POA: Diagnosis present

## 2015-11-23 HISTORY — PX: GASTRIC ROUX-EN-Y: SHX5262

## 2015-11-23 LAB — POCT PREGNANCY, URINE: Preg Test, Ur: NEGATIVE

## 2015-11-23 LAB — HEMOGLOBIN AND HEMATOCRIT, BLOOD
HEMATOCRIT: 40.6 % (ref 35.0–47.0)
HEMOGLOBIN: 13.6 g/dL (ref 12.0–16.0)

## 2015-11-23 SURGERY — LAPAROSCOPIC ROUX-EN-Y GASTRIC BYPASS WITH UPPER ENDOSCOPY
Anesthesia: General | Wound class: Clean Contaminated

## 2015-11-23 MED ORDER — PREMIER PROTEIN SHAKE: 2.0000 [oz_av] | Freq: Four times a day (QID) | ORAL | Status: DC

## 2015-11-23 MED ORDER — ROCURONIUM BROMIDE 100 MG/10ML IV SOLN
INTRAVENOUS | Status: DC | PRN
  Administered 2015-11-23 (×3): 10 mg via INTRAVENOUS
  Administered 2015-11-23: 50 mg via INTRAVENOUS

## 2015-11-23 MED ORDER — TRAZODONE HCL 100 MG PO TABS
300.0000 mg | ORAL_TABLET | Freq: Every evening | ORAL | Status: DC | PRN
  Administered 2015-11-24: 300 mg via ORAL
  Filled 2015-11-23 (×2): qty 3

## 2015-11-23 MED ORDER — LIDOCAINE HCL (CARDIAC) 20 MG/ML IV SOLN
INTRAVENOUS | Status: DC | PRN
  Administered 2015-11-23: 40 mg via INTRAVENOUS

## 2015-11-23 MED ORDER — ENALAPRILAT 1.25 MG/ML IV SOLN
1.2500 mg | Freq: Four times a day (QID) | INTRAVENOUS | Status: DC | PRN
  Filled 2015-11-23: qty 1

## 2015-11-23 MED ORDER — DEXTROSE 5 % IV SOLN
3.0000 g | INTRAVENOUS | Status: AC
  Administered 2015-11-23: 3 g via INTRAVENOUS
  Filled 2015-11-23: qty 3000

## 2015-11-23 MED ORDER — ENOXAPARIN SODIUM 30 MG/0.3ML ~~LOC~~ SOLN
30.0000 mg | Freq: Two times a day (BID) | SUBCUTANEOUS | Status: DC
  Administered 2015-11-24 – 2015-11-25 (×3): 30 mg via SUBCUTANEOUS
  Filled 2015-11-23 (×3): qty 0.3

## 2015-11-23 MED ORDER — NEOSTIGMINE METHYLSULFATE 10 MG/10ML IV SOLN
INTRAVENOUS | Status: DC | PRN
  Administered 2015-11-23: 4 mg via INTRAVENOUS

## 2015-11-23 MED ORDER — PROPOFOL 10 MG/ML IV BOLUS
INTRAVENOUS | Status: DC | PRN
  Administered 2015-11-23: 200 mg via INTRAVENOUS

## 2015-11-23 MED ORDER — ONDANSETRON HCL 4 MG/2ML IJ SOLN: 4.0000 mg | INTRAMUSCULAR | Status: DC | PRN

## 2015-11-23 MED ORDER — PANTOPRAZOLE SODIUM 40 MG IV SOLR
40.0000 mg | Freq: Every day | INTRAVENOUS | Status: DC
  Administered 2015-11-23 – 2015-11-24 (×2): 40 mg via INTRAVENOUS
  Filled 2015-11-23 (×3): qty 40

## 2015-11-23 MED ORDER — GLYCOPYRROLATE 0.2 MG/ML IJ SOLN
INTRAMUSCULAR | Status: DC | PRN
  Administered 2015-11-23: .6 mg via INTRAVENOUS

## 2015-11-23 MED ORDER — FENTANYL CITRATE (PF) 100 MCG/2ML IJ SOLN
INTRAMUSCULAR | Status: AC
  Filled 2015-11-23: qty 2

## 2015-11-23 MED ORDER — ACETAMINOPHEN 10 MG/ML IV SOLN
INTRAVENOUS | Status: AC
  Filled 2015-11-23: qty 100

## 2015-11-23 MED ORDER — ALPRAZOLAM 0.5 MG PO TABS
0.5000 mg | ORAL_TABLET | Freq: Two times a day (BID) | ORAL | Status: DC | PRN
  Administered 2015-11-24: 0.5 mg via ORAL
  Filled 2015-11-23: qty 1

## 2015-11-23 MED ORDER — HYDROMORPHONE HCL 1 MG/ML IJ SOLN
1.0000 mg | INTRAMUSCULAR | Status: DC | PRN
  Administered 2015-11-23 (×2): 1 mg via INTRAVENOUS
  Administered 2015-11-23 – 2015-11-24 (×5): 2 mg via INTRAVENOUS
  Filled 2015-11-23 (×4): qty 2
  Filled 2015-11-23 (×2): qty 1
  Filled 2015-11-23: qty 2

## 2015-11-23 MED ORDER — LABETALOL HCL 5 MG/ML IV SOLN
10.0000 mg | Freq: Four times a day (QID) | INTRAVENOUS | Status: DC | PRN
  Filled 2015-11-23: qty 4

## 2015-11-23 MED ORDER — DULOXETINE HCL 60 MG PO CPEP
60.0000 mg | ORAL_CAPSULE | Freq: Two times a day (BID) | ORAL | Status: DC
  Administered 2015-11-23 – 2015-11-25 (×4): 60 mg via ORAL
  Filled 2015-11-23 (×4): qty 1

## 2015-11-23 MED ORDER — OXYCODONE HCL 5 MG/5ML PO SOLN: 5.0000 mg | Freq: Once | ORAL | Status: DC | PRN

## 2015-11-23 MED ORDER — UNJURY CHICKEN SOUP POWDER
2.0000 [oz_av] | Freq: Three times a day (TID) | ORAL | Status: DC
  Administered 2015-11-24 – 2015-11-25 (×4): 2 [oz_av] via ORAL

## 2015-11-23 MED ORDER — BUPIVACAINE-EPINEPHRINE (PF) 0.25% -1:200000 IJ SOLN
INTRAMUSCULAR | Status: AC
  Filled 2015-11-23: qty 30

## 2015-11-23 MED ORDER — KCL IN DEXTROSE-NACL 20-5-0.45 MEQ/L-%-% IV SOLN
INTRAVENOUS | Status: DC
  Administered 2015-11-23: 16:00:00 via INTRAVENOUS
  Administered 2015-11-24: 1000 mL via INTRAVENOUS
  Administered 2015-11-24 – 2015-11-25 (×2): via INTRAVENOUS
  Filled 2015-11-23 (×8): qty 1000

## 2015-11-23 MED ORDER — LACTATED RINGERS IV SOLN
INTRAVENOUS | Status: DC
  Administered 2015-11-23 (×4): via INTRAVENOUS

## 2015-11-23 MED ORDER — DIPHENHYDRAMINE HCL 50 MG/ML IJ SOLN: 25.0000 mg | Freq: Four times a day (QID) | INTRAMUSCULAR | Status: DC | PRN

## 2015-11-23 MED ORDER — FLUTICASONE PROPIONATE 50 MCG/ACT NA SUSP
1.0000 | Freq: Every day | NASAL | Status: DC
  Filled 2015-11-23: qty 16

## 2015-11-23 MED ORDER — DEXAMETHASONE SODIUM PHOSPHATE 4 MG/ML IJ SOLN
INTRAMUSCULAR | Status: DC | PRN
  Administered 2015-11-23: 10 mg via INTRAVENOUS

## 2015-11-23 MED ORDER — OXYCODONE HCL 5 MG PO TABS: 5.0000 mg | ORAL_TABLET | Freq: Once | ORAL | Status: DC | PRN

## 2015-11-23 MED ORDER — PRAZOSIN HCL 2 MG PO CAPS
2.0000 mg | ORAL_CAPSULE | Freq: Every day | ORAL | Status: DC
  Administered 2015-11-23 – 2015-11-24 (×2): 2 mg via ORAL
  Filled 2015-11-23 (×3): qty 1

## 2015-11-23 MED ORDER — ACETAMINOPHEN 160 MG/5ML PO SOLN: 650.0000 mg | ORAL | Status: DC | PRN

## 2015-11-23 MED ORDER — FENTANYL CITRATE (PF) 100 MCG/2ML IJ SOLN
INTRAMUSCULAR | Status: DC | PRN
  Administered 2015-11-23 (×2): 50 ug via INTRAVENOUS
  Administered 2015-11-23: 100 ug via INTRAVENOUS
  Administered 2015-11-23: 50 ug via INTRAVENOUS

## 2015-11-23 MED ORDER — IPRATROPIUM-ALBUTEROL 0.5-2.5 (3) MG/3ML IN SOLN: 3.0000 mL | Freq: Once | RESPIRATORY_TRACT | Status: DC

## 2015-11-23 MED ORDER — HYDROCODONE-ACETAMINOPHEN 7.5-325 MG/15ML PO SOLN
5.0000 mL | ORAL | Status: DC | PRN
  Administered 2015-11-23 – 2015-11-24 (×3): 10 mL via ORAL
  Filled 2015-11-23 (×3): qty 15

## 2015-11-23 MED ORDER — BUPIVACAINE-EPINEPHRINE (PF) 0.25% -1:200000 IJ SOLN
INTRAMUSCULAR | Status: DC | PRN
  Administered 2015-11-23: 60 mL

## 2015-11-23 MED ORDER — EPHEDRINE SULFATE 50 MG/ML IJ SOLN
INTRAMUSCULAR | Status: DC | PRN
  Administered 2015-11-23: 10 mg via INTRAVENOUS

## 2015-11-23 MED ORDER — LORATADINE 10 MG PO TABS
10.0000 mg | ORAL_TABLET | Freq: Every day | ORAL | Status: DC
  Administered 2015-11-24 – 2015-11-25 (×2): 10 mg via ORAL
  Filled 2015-11-23 (×2): qty 1

## 2015-11-23 MED ORDER — FENTANYL CITRATE (PF) 100 MCG/2ML IJ SOLN
25.0000 ug | INTRAMUSCULAR | Status: AC | PRN
  Administered 2015-11-23 (×6): 25 ug via INTRAVENOUS

## 2015-11-23 MED ORDER — MIDAZOLAM HCL 2 MG/2ML IJ SOLN
INTRAMUSCULAR | Status: DC | PRN
  Administered 2015-11-23: 2 mg via INTRAVENOUS

## 2015-11-23 MED ORDER — SODIUM CHLORIDE 0.9 % IJ SOLN
INTRAMUSCULAR | Status: AC
  Filled 2015-11-23: qty 10

## 2015-11-23 MED ORDER — ACETAMINOPHEN 10 MG/ML IV SOLN
INTRAVENOUS | Status: DC | PRN
  Administered 2015-11-23: 1000 mg via INTRAVENOUS

## 2015-11-23 MED ORDER — EPHEDRINE SULFATE 50 MG/ML IJ SOLN: INTRAMUSCULAR | Status: DC | PRN

## 2015-11-23 MED ORDER — BUPIVACAINE-EPINEPHRINE (PF) 0.25% -1:200000 IJ SOLN
INTRAMUSCULAR | Status: AC
  Filled 2015-11-23: qty 60

## 2015-11-23 MED ORDER — CEFAZOLIN (ANCEF) 1 G IV SOLR: 3.0000 g | INTRAVENOUS | Status: DC

## 2015-11-23 SURGICAL SUPPLY — 55 items
APPLIER CLIP 5 13 M/L LIGAMAX5 (MISCELLANEOUS)
APPLIER CLIP ROT 13.4 12 LRG (CLIP)
BANDAGE ELASTIC 6 LF NS (GAUZE/BANDAGES/DRESSINGS) ×4 IMPLANT
BLADE SURG SZ11 CARB STEEL (BLADE) ×2 IMPLANT
CANISTER SUCT 1200ML W/VALVE (MISCELLANEOUS) ×2 IMPLANT
CATH FOL LEG HOLDER (MISCELLANEOUS) ×2 IMPLANT
CATH TRAY 16F METER LATEX (MISCELLANEOUS) ×2 IMPLANT
CHLORAPREP W/TINT 26ML (MISCELLANEOUS) ×4 IMPLANT
CLIP APPLIE 5 13 M/L LIGAMAX5 (MISCELLANEOUS) IMPLANT
CLIP APPLIE ROT 13.4 12 LRG (CLIP) IMPLANT
DEFOGGER SCOPE WARMER CLEARIFY (MISCELLANEOUS) ×2 IMPLANT
DRAPE UTILITY 15X26 TOWEL STRL (DRAPES) ×4 IMPLANT
FILTER LAP SMOKE EVAC STRL (MISCELLANEOUS) ×2 IMPLANT
GLOVE BIO SURGEON STRL SZ7 (GLOVE) ×14 IMPLANT
GLOVE BIOGEL PI IND STRL 8.5 (GLOVE) ×1 IMPLANT
GLOVE BIOGEL PI INDICATOR 8.5 (GLOVE) ×1
GLOVE SURG SYN 8.0 (GLOVE) ×2 IMPLANT
GOWN STRL REUS W/ TWL LRG LVL3 (GOWN DISPOSABLE) ×4 IMPLANT
GOWN STRL REUS W/ TWL XL LVL3 (GOWN DISPOSABLE) ×1 IMPLANT
GOWN STRL REUS W/TWL LRG LVL3 (GOWN DISPOSABLE) ×4
GOWN STRL REUS W/TWL XL LVL3 (GOWN DISPOSABLE) ×1
GRASPER SUT TROCAR 14GX15 (MISCELLANEOUS) ×2 IMPLANT
IRRIGATION STRYKERFLOW (MISCELLANEOUS) ×1 IMPLANT
IRRIGATOR STRYKERFLOW (MISCELLANEOUS) ×2
IV NS 1000ML (IV SOLUTION) ×1
IV NS 1000ML BAXH (IV SOLUTION) ×1 IMPLANT
KIT RM TURNOVER STRD PROC AR (KITS) ×2 IMPLANT
LABEL OR SOLS (LABEL) ×2 IMPLANT
LIQUID BAND (GAUZE/BANDAGES/DRESSINGS) ×2 IMPLANT
NDL SAFETY 22GX1.5 (NEEDLE) ×2 IMPLANT
NS IRRIG 500ML POUR BTL (IV SOLUTION) IMPLANT
PACK LAP CHOLECYSTECTOMY (MISCELLANEOUS) ×2 IMPLANT
RELOAD BLUE (STAPLE) ×2 IMPLANT
RELOAD STAPLER BLUE 60MM (STAPLE) ×2 IMPLANT
RELOAD STAPLER GOLD 60MM (STAPLE) ×3 IMPLANT
RELOAD STAPLER WHITE 60MM (STAPLE) ×4 IMPLANT
SHEARS HARMONIC ACE PLUS 45CM (MISCELLANEOUS) ×2 IMPLANT
SLEEVE ENDOPATH XCEL 5M (ENDOMECHANICALS) ×6 IMPLANT
SLEEVE GASTRECTOMY 36FR VISIGI (MISCELLANEOUS) ×2 IMPLANT
STAPLER ECHELON LONG 60 440 (INSTRUMENTS) ×2 IMPLANT
STAPLER RELOAD BLUE 60MM (STAPLE) ×4
STAPLER RELOAD GOLD 60MM (STAPLE) ×6
STAPLER RELOAD WHITE 60MM (STAPLE) ×8
SUT DEVICE BRAIDED 0X39 (SUTURE) ×2 IMPLANT
SUT DEVICE BRAIDED 2.0X39 (SUTURE) ×14 IMPLANT
SUT DVC VICRYL PGA 2.0X39 (SUTURE) ×6 IMPLANT
SUT ETHILON 3-0 FS-10 30 BLK (SUTURE) ×2
SUT MNCRL AB 4-0 PS2 18 (SUTURE) ×2 IMPLANT
SUT VIC AB 0 SH 27 (SUTURE) ×2 IMPLANT
SUTURE EHLN 3-0 FS-10 30 BLK (SUTURE) ×1 IMPLANT
SYR 20CC LL (SYRINGE) IMPLANT
TROCAR XCEL 12X100 BLDLESS (ENDOMECHANICALS) ×2 IMPLANT
TROCAR XCEL NON-BLD 5MMX100MML (ENDOMECHANICALS) ×2 IMPLANT
TUBING INSUFFLATOR HEATED (MISCELLANEOUS) ×2 IMPLANT
WATER STERILE IRR 1000ML POUR (IV SOLUTION) ×4 IMPLANT

## 2015-11-23 NOTE — Interval H&P Note (Signed)
History and Physical Interval Note:  11/23/2015 7:18 AM  Melinda Harrison  has presented today for surgery, with the diagnosis of MORBID OBESITY  The various methods of treatment have been discussed with the patient and family. After consideration of risks, benefits and other options for treatment, the patient has consented to  Procedure(s): LAPAROSCOPIC ROUX-EN-Y GASTRIC BYPASS WITH UPPER ENDOSCOPY (N/A) as a surgical intervention .  [name] history has been reviewed, patient examined, no change in status, stable for surgery.  I have reviewed the patient's chart and labs.  Questions were answered to the patient's satisfaction.     [name], Williams Dietrick A

## 2015-11-23 NOTE — Op Note (Signed)
PATIENT: Melinda Harrison 1966/10/04  PROCEDURE PERFORMED: Procedure(s): LAPAROSCOPIC ROUX-EN-Y GASTRIC BYPASS, HIATAL HERNIA REPAIR (N/A) PRE-OP DIAGNOSIS: MORBID OBESITY,gerd POST-OP DIAGNOSIS: MORBID OBESITY,hiatal hernia ESTIMATED BLOOD LOSS: less than 50 mL SURGEON: Effie Shyyner, Asah Lamay A  ASSISTANT: Tilden Fossaon Drinkwater,PA   PROCEDURE IN DETAIL:The patient was brought to the operating room,  placed in a supine position, general anesthesia obtained with orotracheal intubation. Foley catheter inserted sterilely. TED hose and Thrombo-Gards applied. A foot board applied at the end of the operative bed. The chest and abdomen were sterilely prepped and draped. A 5 mm Optiview trocar  introduced in the left upper quadrant of the abdomen under direct visualization. Pneumoperitoneum obtained with carbon dioxide. Four additional trocars were introduced across the upper abdomen. The omentum was bisected in the region of the mid transverse colon. The ligament of Treitz was identified and the bowel followed distally 50 cm at which point it was secured to the inferior margin of the stomach. A Nathanson liver retractor introduced through a subxiphoid defect and used to elevate the left lobe of the liver revealing a moderate indentation of the hiatus with thinning of the peritoneum consistent with a sliding hiatal hernia as noted preoperative upper GI series. Given her long-standing history of reflux disease, it was decided to proceed with repair of this. The gastrohepatic ligament was incised followed by division of the peritoneum across the anterior hiatus. Blunt dissection was then used to mobilize the herniated peritoneum and both upper stomach and lower esophagus away from the overlying pericardium. The peritoneum was incised just lateral to the right crus, and blunt dissection was then used to reduce herniated lesser sac fatty tissue. The patient had division of phrenoesophageal ligament associated with the left crus and  also attachments of the upper stomach to the undersurface of the left hemidiaphragm. Further circumferential dissection of the esophagus was then performed within the lower mediastinum, sweeping the esophagus and anterior and posterior vagal nerves away from the pericardium and pleural surfaces and also mobilizing away from the aorta. The circumferential dissection was extended into the lower mediastinum over a distance of approximately 7 cm, ultimately resulted in delivery of 2 cm of esophagus lying comfortably in the abdominal cavity. Posterior crural repair was then performed with 2 interrupted 0 Ethibond sutures. The patient then had division of the vascular pedicles immediately adjacent to the lesser curvature of the stomach beginning approximately 6 cm inferior to the GE junction. This was extended superiorly over a distance of approximately 1 cm revealing the lesser sac. A series of gold load GIA staplers were then used to create a proximal gastric pouch, first firing placed in a transverse direction followed by a vertical line of staples brought out just lateral to the angle of His. This was done with a 2836 JamaicaFrench ViSiGi device deployed in the upper stomach to be used as an aid in sizing of the gastric pouch. Next, the mobilized jejunum was brought up and secured to the posterior aspect of the gastric pouch.  An enterotomy was then made in the distal posterior aspect of the gastric pouch and opposing portion of the mobilized jejunum. A blue load stapler was used to create a common lumen fired at the 2.5 cm mark. The resulting enterotomy closed with a running 2-0 Polysorb suture. The suture and staple line then reinforced with an additional running Polysorb suture. The latter  performed with a 8236 JamaicaFrench ViSiGi directed through the area of the anastomosis and used as an aid in sizing of the  anastomotic lumen. The bowel was divided immediately proximal to this, creating in effect a biliary limb.  There was a  partial division of the mesentery. A Roux limb was marked at 125 cm at which point a side-to-side biliary limb to common channel limb anastomosis was created. This was accomplished with enterotomies on the antimesenteric border of these 2 portions of bowel and a white load stapler  used to create a common lumen with a device fired at 3.5 cm mark. The resulting enterotomy closed with a repeat firing of white load GI stapler. Anti torsion sutures placed distally, the mesenteric window closed with a running 2-0 Surgidac suture. Petersen defect closed in a similar fashion. The bowel was occluded distal to the gastrojejunal anastomosis and insufflation  performed, and a saline bath  performed. No air leak identified in this anastomotic region. The divided limbs of the omentum were secured over the area of the gastrojejunal anastomosis. The fascia and peritoneum of this defect  then closed with 0 Vicryl suture passed by way of a needle suture system under direct visualization. Should note the gallbladder fossa was inspected for hemostasis and found to be adequate. The pneumoperitoneum  relieved, the trocars removed, the wounds  injected with 0.25% Marcaine and closed with 4-0 Monocryl in the dermis followed by Dermabond. Patient  allowed to recover at this point having tolerated the procedure well.

## 2015-11-23 NOTE — H&P (Signed)
History and physical on paper chart, no change noted

## 2015-11-23 NOTE — Anesthesia Postprocedure Evaluation (Signed)
Anesthesia Post Note  Patient: [name]  Procedure(s) Performed: Procedure(s) (LRB): LAPAROSCOPIC ROUX-EN-Y GASTRIC BYPASS, HIATAL HERNIA REPAIR (N/A)  Patient location during evaluation: PACU Anesthesia Type: General Level of consciousness: awake and alert Pain management: pain level controlled Vital Signs Assessment: post-procedure vital signs reviewed and stable Respiratory status: spontaneous breathing, nonlabored ventilation, respiratory function stable and patient connected to nasal cannula oxygen Cardiovascular status: blood pressure returned to baseline and stable Postop Assessment: no signs of nausea or vomiting Anesthetic complications: no    Last Vitals:  Filed Vitals:   11/23/15 1140 11/23/15 1145  BP: 150/82 130/78  Pulse: 82 87  Temp:    Resp: 8 13    Last Pain:  Filed Vitals:   11/23/15 1145  PainSc: 3                  Jomarie LongsJoseph K Cyle Kenyon

## 2015-11-23 NOTE — Anesthesia Procedure Notes (Signed)
Procedure Name: Intubation Date/Time: 11/23/2015 7:36 AM Performed by: Henrietta HooverPOPE, Siniya Lichty Pre-anesthesia Checklist: Patient identified, Emergency Drugs available, Suction available, Patient being monitored and Timeout performed Patient Re-evaluated:Patient Re-evaluated prior to inductionOxygen Delivery Method: Circle system utilized Preoxygenation: Pre-oxygenation with 100% oxygen Intubation Type: IV induction Laryngoscope Size: Mac and 3 Grade View: Grade I Tube type: Oral Tube size: 7.0 mm Number of attempts: 1 Secured at: 21 cm Tube secured with: Tape Dental Injury: Teeth and Oropharynx as per pre-operative assessment

## 2015-11-23 NOTE — Anesthesia Preprocedure Evaluation (Addendum)
Anesthesia Evaluation  Patient identified by MRN, date of birth, ID band Patient awake    Reviewed: Allergy & Precautions, H&P , NPO status , Patient's Chart, lab work & pertinent test results  History of Anesthesia Complications Negative for: history of anesthetic complications  Airway Mallampati: III  TM Distance: >3 FB Neck ROM: full    Dental no notable dental hx. (+) Teeth Intact   Pulmonary neg shortness of breath, asthma , COPD,    Pulmonary exam normal breath sounds clear to auscultation       Cardiovascular Exercise Tolerance: Good hypertension, (-) angina(-) Past MI and (-) DOE Normal cardiovascular exam Rhythm:regular Rate:Normal     Neuro/Psych  Headaches, PSYCHIATRIC DISORDERS Anxiety Depression    GI/Hepatic Neg liver ROS, GERD  Controlled,  Endo/Other  negative endocrine ROS  Renal/GU negative Renal ROS  negative genitourinary   Musculoskeletal   Abdominal   Peds  Hematology negative hematology ROS (+)   Anesthesia Other Findings Past Medical History:   Hypertension                                                 COPD (chronic obstructive pulmonary disease) (*              Depression                                                   Asthma                                                       Back pain                                                    PTSD (post-traumatic stress disorder)                        Anxiety                                                      Headache                                                     Anemia                                                       IBS (irritable bowel syndrome)  Past Surgical History:   NECK SURGERY                                                  TONSILLECTOMY                                                 DILATION AND CURETTAGE OF UTERUS                             BMI    Body Mass Index   43.59 kg/m  2    Patient reports that she can eat cooked egg products with no problems.   Reproductive/Obstetrics negative OB ROS                            Anesthesia Physical Anesthesia Plan  ASA: III  Anesthesia Plan: General ETT   Post-op Pain Management:    Induction:   Airway Management Planned:   Additional Equipment:   Intra-op Plan:   Post-operative Plan:   Informed Consent: I have reviewed the patients History and Physical, chart, labs and discussed the procedure including the risks, benefits and alternatives for the proposed anesthesia with the patient or authorized representative who has indicated his/her understanding and acceptance.   Dental Advisory Given  Plan Discussed with: Anesthesiologist, CRNA and Surgeon  Anesthesia Plan Comments:         Anesthesia Quick Evaluation

## 2015-11-23 NOTE — Brief Op Note (Signed)
11/23/2015  10:32 AM  PATIENT:  Garwin BrothersSusan K Rudnick  49 y.o. female  PRE-OPERATIVE DIAGNOSIS:  MORBID OBESITY  POST-OPERATIVE DIAGNOSIS:  MORBID OBESITY with HIATAL HERNIA  PROCEDURE:  Procedure(s): LAPAROSCOPIC ROUX-EN-Y GASTRIC BYPASS, HIATAL HERNIA REPAIR (N/A)  SURGEON:  Surgeon(s) and Role:    * Everette RankMichael A Tyner, MD - Primary  PHYSICIAN ASSISTANT: Vivika Poythress, PA-C   ANESTHESIA:   general  EBL:  Total I/O In: 1500 [I.V.:1500] Out: 175 [Urine:100; Blood:75]  BLOOD ADMINISTERED:none  DRAINS: none   LOCAL MEDICATIONS USED:  MARCAINE     SPECIMEN:  No Specimen  DISPOSITION OF SPECIMEN:  N/A  COUNTS:  YES  TOURNIQUET:  * No tourniquets in log *  DICTATION: .Note written in EPIC  PLAN OF CARE: Admit to inpatient   PATIENT DISPOSITION:  PACU - hemodynamically stable.   Delay start of Pharmacological VTE agent (>24hrs) due to surgical blood loss or risk of bleeding: no

## 2015-11-23 NOTE — Progress Notes (Signed)
INTERVENTION:  RD consulted for nutrition education regarding inpatient bariatric surgery.   RD provided "The Liquid Diet" handout from the Bariatric Surgery Guide from the [facility]. This handout previously provided to patient prior to surgery is a duplicate copy. Discussed what foods/liquids are consistent with a Clear Liquid Diet and reinforced Key Concepts such as no carbonation, no caffeine, or sugar containing beverages. Provided methods to prevent dehydration and promote protein intake, using clock and sample fluid schedule. Pt and family with many questions and upset with the lack of continuity of care and different people giving them different information. Answered all questions. RD encouraged follow-up with outpatient dietitian after discharge.  Teach back method used.  Expect good compliance.  NUTRITION DIAGNOSIS:  Food and nutrition knowledge related deficit related to recent bariatric surgery as evidenced by dietitian consult for nutrition education   GOAL:  Patient will be able to sip and tolerate CL within 24-48 hours  MONITOR:  Energy intake Digestive system  ASSESSMENT:  Pt s/p lap roux-en-y gastric bypass with hiatal hernia repair today  Past Medical History  Diagnosis Date  . Hypertension   . COPD (chronic obstructive pulmonary disease) (HCC)   . Depression   . Asthma   . Back pain   . PTSD (post-traumatic stress disorder)   . Anxiety   . Headache   . Anemia   . IBS (irritable bowel syndrome)     Body mass index is 43.6 kg/(m^2).   Current diet order is Bariatric CL with unjury supplement TID, patient is consuming approximately 2 ounces over 15 minutes at this time. Tolerating broth well. Pt has many food allergies/intoelrances including tea, breakfast eggs, pepper and milk (uses soy milk products).   Labs and medications reviewed.   LOW Care Level  Melinda Starcherate Kentrel Clevenger MS, IowaRD, LDN [phone] Pager

## 2015-11-23 NOTE — Transfer of Care (Signed)
Immediate Anesthesia Transfer of Care Note  Patient: Melinda Harrison  Procedure(s) Performed: Procedure(s): LAPAROSCOPIC ROUX-EN-Y GASTRIC BYPASS, HIATAL HERNIA REPAIR (N/A)  Patient Location: PACU  Anesthesia Type:General  Level of Consciousness: awake  Airway & Oxygen Therapy: Patient Spontanous Breathing and Patient connected to face mask oxygen  Post-op Assessment: Report given to RN and Post -op Vital signs reviewed and stable  Post vital signs: Reviewed and stable  Last Vitals:  Filed Vitals:   11/23/15 0611  BP: 149/81  Pulse: 78  Temp: 36.7 C  Resp: 16    Complications: No apparent anesthesia complications

## 2015-11-24 LAB — COMPREHENSIVE METABOLIC PANEL
ALT: 58 U/L — ABNORMAL HIGH (ref 14–54)
ANION GAP: 5 (ref 5–15)
AST: 44 U/L — ABNORMAL HIGH (ref 15–41)
Albumin: 3.7 g/dL (ref 3.5–5.0)
Alkaline Phosphatase: 53 U/L (ref 38–126)
BUN: 12 mg/dL (ref 6–20)
CHLORIDE: 106 mmol/L (ref 101–111)
CO2: 28 mmol/L (ref 22–32)
Calcium: 8.2 mg/dL — ABNORMAL LOW (ref 8.9–10.3)
Creatinine, Ser: 0.76 mg/dL (ref 0.44–1.00)
Glucose, Bld: 128 mg/dL — ABNORMAL HIGH (ref 65–99)
POTASSIUM: 4 mmol/L (ref 3.5–5.1)
Sodium: 139 mmol/L (ref 135–145)
TOTAL PROTEIN: 6.3 g/dL — AB (ref 6.5–8.1)
Total Bilirubin: 0.2 mg/dL — ABNORMAL LOW (ref 0.3–1.2)

## 2015-11-24 LAB — CBC WITH DIFFERENTIAL/PLATELET
BASOS ABS: 0 10*3/uL (ref 0–0.1)
Basophils Relative: 0 %
EOS PCT: 0 %
Eosinophils Absolute: 0 10*3/uL (ref 0–0.7)
HCT: 37.3 % (ref 35.0–47.0)
Hemoglobin: 12.5 g/dL (ref 12.0–16.0)
LYMPHS PCT: 13 %
Lymphs Abs: 1.8 10*3/uL (ref 1.0–3.6)
MCH: 32 pg (ref 26.0–34.0)
MCHC: 33.4 g/dL (ref 32.0–36.0)
MCV: 95.8 fL (ref 80.0–100.0)
MONO ABS: 1.4 10*3/uL — AB (ref 0.2–0.9)
MONOS PCT: 10 %
Neutro Abs: 10.7 10*3/uL — ABNORMAL HIGH (ref 1.4–6.5)
Neutrophils Relative %: 77 %
PLATELETS: 228 10*3/uL (ref 150–440)
RBC: 3.89 MIL/uL (ref 3.80–5.20)
RDW: 12.8 % (ref 11.5–14.5)
WBC: 13.9 10*3/uL — ABNORMAL HIGH (ref 3.6–11.0)

## 2015-11-24 MED ORDER — ENOXAPARIN SODIUM 30 MG/0.3ML ~~LOC~~ SOLN: 30.0000 mg | SUBCUTANEOUS | Status: DC

## 2015-11-24 MED ORDER — OXYCODONE HCL 5 MG/5ML PO SOLN: 10.0000 mg | ORAL | Status: DC | PRN

## 2015-11-24 MED ORDER — HYDROCODONE-ACETAMINOPHEN 7.5-325 MG/15ML PO SOLN
10.0000 mL | ORAL | Status: DC | PRN
  Administered 2015-11-24 (×2): 15 mL via ORAL
  Filled 2015-11-24 (×2): qty 15

## 2015-11-24 MED ORDER — HYDROCODONE-ACETAMINOPHEN 7.5-325 MG/15ML PO SOLN: 5.0000 mL | ORAL | Status: DC | PRN

## 2015-11-24 MED ORDER — HYDROMORPHONE HCL 1 MG/ML IJ SOLN
1.0000 mg | INTRAMUSCULAR | Status: DC | PRN
  Administered 2015-11-24: 1 mg via INTRAVENOUS
  Filled 2015-11-24: qty 1

## 2015-11-24 MED ORDER — OXYCODONE HCL 5 MG/5ML PO SOLN
10.0000 mg | ORAL | Status: DC | PRN
  Administered 2015-11-24 – 2015-11-25 (×3): 10 mg via ORAL
  Filled 2015-11-24 (×4): qty 10

## 2015-11-24 MED ORDER — KETOROLAC TROMETHAMINE 30 MG/ML IJ SOLN
30.0000 mg | Freq: Four times a day (QID) | INTRAMUSCULAR | Status: DC
  Administered 2015-11-24 – 2015-11-25 (×5): 30 mg via INTRAVENOUS
  Filled 2015-11-24 (×5): qty 1

## 2015-11-24 NOTE — Discharge Summary (Signed)
Physician Discharge Summary  Patient ID: Melinda Harrison MRN: [id number] DOB/AGE: February 22, 1966 49 y.o.  Admit date: 11/23/2015 Discharge date: 11/24/2015  Admission Diagnoses:morbid obesity  Discharge Diagnoses:  Active Problems:   Bariatric surgery status    Procedure(s): LAPAROSCOPIC ROUX-EN-Y GASTRIC BYPASS, HIATAL HERNIA REPAIR (N/A)  Discharged Condition: good  Hospital Course: Tolerated operative procedure well. Has ambulated well and voided well. Taking po well.  Consults: None  Significant Diagnostic Studies: none  Treatments: IV hydration  Discharge Exam: Blood pressure 125/69, pulse 96, temperature 97.3 F (36.3 C), temperature source Oral, resp. rate 18, height  (1.676 m), weight 122.528 kg (270 lb 2 oz), last menstrual period 11/20/2015, SpO2 90 %. General appearance: alert and cooperative Resp: clear to auscultation bilaterally GI: soft, non-tender; bowel sounds normal; no masses,  no organomegaly  Disposition: 01-Home or Self Care  Discharge Instructions    Ambulate hourly while awake    Complete by:  As directed      Call MD for:  difficulty breathing, headache or visual disturbances    Complete by:  As directed      Call MD for:  persistant dizziness or light-headedness    Complete by:  As directed      Call MD for:  persistant nausea and vomiting    Complete by:  As directed      Call MD for:  redness, tenderness, or signs of infection (pain, swelling, redness, odor or green/yellow discharge around incision site)    Complete by:  As directed      Call MD for:  severe uncontrolled pain    Complete by:  As directed      Call MD for:  temperature >101 F    Complete by:  As directed      Diet bariatric full liquid    Complete by:  As directed      Discharge instructions    Complete by:  As directed   No lifting > 20 lbs  Return to office as scheduled     Incentive spirometry    Complete by:  As directed   Perform hourly while awake             Medication List    STOP taking these medications        furosemide 80 MG tablet  Commonly known as:  LASIX     medroxyPROGESTERone 2.5 MG tablet  Commonly known as:  PROVERA     potassium chloride 10 MEQ tablet  Commonly known as:  K-DUR,KLOR-CON      TAKE these medications        ALPRAZolam 0.5 MG tablet  Commonly known as:  XANAX  Take 0.5 mg by mouth 2 (two) times daily as needed for anxiety.     cetirizine 10 MG tablet  Commonly known as:  ZYRTEC  Take 10 mg by mouth daily.     DULoxetine 60 MG capsule  Commonly known as:  CYMBALTA  Take 1 capsule by mouth 2 (two) times daily.     enoxaparin 30 MG/0.3ML injection  Commonly known as:  LOVENOX  Inject 0.3 mLs (30 mg total) into the skin daily.     EPIPEN IJ  Inject 1 application as directed once as needed (for allergies).     ESTRACE 1 MG tablet  Generic drug:  estradiol  Take 1 mg by mouth daily.     fluticasone 50 MCG/ACT nasal spray  Commonly known as:  FLONASE  Place 1 spray into  the nose as needed.     HYDROcodone-acetaminophen 7.5-325 mg/15 ml solution  Commonly known as:  HYCET  Take 5-10 mLs by mouth every 4 (four) hours as needed for moderate pain or severe pain.     prazosin 2 MG capsule  Commonly known as:  MINIPRESS  Take 2 mg by mouth at bedtime.     traZODone 150 MG tablet  Commonly known as:  DESYREL  Take 300 mg by mouth at bedtime.         Signed: [name], [name] 11/24/2015, 8:19 AM

## 2015-11-24 NOTE — Progress Notes (Signed)
D/c deferred because of increased pain with oral pain meds only. Improved with addition of toradol. Abdomen and vital signs remain stable. Plan home in am.

## 2015-11-24 NOTE — Progress Notes (Signed)
Nutrition Follow-up:  Visited pt and family to follow-up with any questions/concerns.  Pt confirmed that she does have a follow-up appointment with outpatient RD in 3 weeks. Confirmed that pt can start "milky" protein supplement on post op day 3.  No further questions or concerns at this time.   Lakeysha Slutsky B. Freida BusmanAllen, RD, LDN [phone] (pager)

## 2015-11-25 NOTE — Progress Notes (Signed)
11/25/2015 1:43 PM   Garwin BrothersSusan K Klemmer to be D/C'd Home per MD order.  Discussed prescriptions and follow up appointments with the patient. Prescriptions given to patient, medication list explained in detail. Pt verbalized understanding.    Medication List    STOP taking these medications        furosemide 80 MG tablet  Commonly known as:  LASIX     medroxyPROGESTERone 2.5 MG tablet  Commonly known as:  PROVERA     potassium chloride 10 MEQ tablet  Commonly known as:  K-DUR,KLOR-CON      TAKE these medications        ALPRAZolam 0.5 MG tablet  Commonly known as:  XANAX  Take 0.5 mg by mouth 2 (two) times daily as needed for anxiety.     cetirizine 10 MG tablet  Commonly known as:  ZYRTEC  Take 10 mg by mouth daily.     DULoxetine 60 MG capsule  Commonly known as:  CYMBALTA  Take 1 capsule by mouth 2 (two) times daily.     enoxaparin 30 MG/0.3ML injection  Commonly known as:  LOVENOX  Inject 0.3 mLs (30 mg total) into the skin daily.     EPIPEN IJ  Inject 1 application as directed once as needed (for allergies).     ESTRACE 1 MG tablet  Generic drug:  estradiol  Take 1 mg by mouth daily.     fluticasone 50 MCG/ACT nasal spray  Commonly known as:  FLONASE  Place 1 spray into the nose as needed.     oxyCODONE 5 MG/5ML solution  Commonly known as:  ROXICODONE  Take 10 mLs (10 mg total) by mouth every 4 (four) hours as needed for moderate pain.     prazosin 2 MG capsule  Commonly known as:  MINIPRESS  Take 2 mg by mouth at bedtime.     traZODone 150 MG tablet  Commonly known as:  DESYREL  Take 300 mg by mouth at bedtime.        Filed Vitals:   11/25/15 0447 11/25/15 0904  BP: 109/54 128/66  Pulse: 94 97  Temp: 98.3 F (36.8 C) 97.9 F (36.6 C)  Resp: 17 20    Skin clean, dry and intact without evidence of skin break down, no evidence of skin tears noted. IV catheter discontinued intact. Site without signs and symptoms of complications. Dressing and  pressure applied. Pt denies pain at this time. No complaints noted.  An After Visit Summary was printed and given to the patient. Patient escorted via WC, and D/C home via private auto.  Bradly Chrisougherty, Hung Rhinesmith E

## 2015-12-10 ENCOUNTER — Telehealth: Payer: Self-pay

## 2015-12-10 NOTE — Telephone Encounter (Signed)
left message asking patient to call me back.  Need to find out if patient restarted the seroquel xr because last phone message pt was off the medication because she had surgery.   Need to find out if and when pt restarted the medication.

## 2015-12-10 NOTE — Telephone Encounter (Signed)
received a fax requesting prior auth on seroquel xr 50mg .

## 2015-12-13 NOTE — Telephone Encounter (Signed)
pt states she not started taking it yet will wait until she see dr. Mayford Knifewilliams on her next visit. she had to stop because of a procdure.

## 2016-01-04 ENCOUNTER — Ambulatory Visit: Payer: BLUE CROSS/BLUE SHIELD | Admitting: Psychiatry

## 2016-02-03 ENCOUNTER — Ambulatory Visit (INDEPENDENT_AMBULATORY_CARE_PROVIDER_SITE_OTHER): Payer: BLUE CROSS/BLUE SHIELD | Admitting: Psychiatry

## 2016-02-03 ENCOUNTER — Encounter: Payer: Self-pay | Admitting: Psychiatry

## 2016-02-03 VITALS — BP 138/88 | HR 90 | Temp 97.3°F | Ht 66.0 in | Wt 222.0 lb

## 2016-02-03 DIAGNOSIS — F331 Major depressive disorder, recurrent, moderate: Secondary | ICD-10-CM | POA: Diagnosis not present

## 2016-02-03 MED ORDER — BREXPIPRAZOLE 1 MG PO TABS
1.0000 mg | ORAL_TABLET | ORAL | Status: DC
Start: 2016-02-03 — End: 2016-03-02
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
## 2016-02-03 NOTE — Patient Instructions (Addendum)
HAVE LABS DONE WITHIN THE NEXT WEEK. Use Rexulti samples .5 mg (one half a milligram) in the morning for one week the increase to 1 mg in the morning.

## 2016-02-29 ENCOUNTER — Telehealth: Payer: Self-pay | Admitting: Psychiatry

## 2016-03-01 NOTE — Telephone Encounter (Signed)
She will need to come in for an appointment so we can discuss her options. This cannot be done over phone.

## 2016-03-02 ENCOUNTER — Encounter: Payer: Self-pay | Admitting: Psychiatry

## 2016-03-02 ENCOUNTER — Ambulatory Visit (INDEPENDENT_AMBULATORY_CARE_PROVIDER_SITE_OTHER): Payer: BLUE CROSS/BLUE SHIELD | Admitting: Psychiatry

## 2016-03-02 DIAGNOSIS — F330 Major depressive disorder, recurrent, mild: Secondary | ICD-10-CM

## 2016-03-02 MED ORDER — ALPRAZOLAM 0.5 MG PO TABS: 0.5000 mg | ORAL_TABLET | Freq: Every evening | ORAL | Status: DC | PRN

## 2016-03-02 MED ORDER — DULOXETINE HCL 60 MG PO CPEP: 60.0000 mg | ORAL_CAPSULE | Freq: Two times a day (BID) | ORAL | Status: DC
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
## 2016-03-07 NOTE — Telephone Encounter (Signed)
pt came into office to be seen today

## 2016-03-23 ENCOUNTER — Ambulatory Visit: Payer: No Typology Code available for payment source | Admitting: Psychiatry

## 2016-03-30 ENCOUNTER — Encounter: Payer: Self-pay | Admitting: Psychiatry

## 2016-03-30 ENCOUNTER — Ambulatory Visit (INDEPENDENT_AMBULATORY_CARE_PROVIDER_SITE_OTHER): Payer: BLUE CROSS/BLUE SHIELD | Admitting: Psychiatry

## 2016-03-30 VITALS — BP 122/82 | HR 76 | Temp 97.9°F | Ht 66.0 in | Wt 201.0 lb

## 2016-03-30 DIAGNOSIS — F331 Major depressive disorder, recurrent, moderate: Secondary | ICD-10-CM

## 2016-03-30 MED ORDER — TRAZODONE HCL 100 MG PO TABS: 200.0000 mg | ORAL_TABLET | Freq: Every evening | ORAL | Status: DC | PRN

## 2016-03-30 MED ORDER — BREXPIPRAZOLE 2 MG PO TABS: 2.0000 mg | ORAL_TABLET | Freq: Every morning | ORAL | Status: DC

## 2016-03-30 MED ORDER — DULOXETINE HCL 60 MG PO CPEP: 60.0000 mg | ORAL_CAPSULE | Freq: Two times a day (BID) | ORAL | Status: DC
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
## 2016-04-27 ENCOUNTER — Encounter: Payer: Self-pay | Admitting: Psychiatry

## 2016-04-27 ENCOUNTER — Ambulatory Visit (INDEPENDENT_AMBULATORY_CARE_PROVIDER_SITE_OTHER): Payer: BLUE CROSS/BLUE SHIELD | Admitting: Psychiatry

## 2016-04-27 VITALS — BP 122/82 | HR 98 | Temp 98.6°F | Ht 66.0 in | Wt 197.0 lb

## 2016-04-27 DIAGNOSIS — F331 Major depressive disorder, recurrent, moderate: Secondary | ICD-10-CM | POA: Diagnosis not present

## 2016-04-27 MED ORDER — BREXPIPRAZOLE 2 MG PO TABS
2.0000 mg | ORAL_TABLET | Freq: Every morning | ORAL | Status: DC
Start: 2016-04-27 — End: 2016-08-24

## 2016-04-27 MED ORDER — DULOXETINE HCL 60 MG PO CPEP: 60.0000 mg | ORAL_CAPSULE | Freq: Two times a day (BID) | ORAL | Status: DC

## 2016-04-27 MED ORDER — TRAZODONE HCL 100 MG PO TABS: 200.0000 mg | ORAL_TABLET | Freq: Every evening | ORAL | Status: DC | PRN

## 2016-04-27 NOTE — Progress Notes (Signed)
Patient ID: Melinda Harrison, female   DOB: 03/20/1966, 50 y.o.   MRN: [id number]  [facility] Progress [facility]  04/27/2016 11:22 AM Melinda Harrison  MRN:  [id number]   HPI: Patient seen today for follow up of her Depression and PTSD. States she has tolerated the Rexulti at 2mg  daily. States she has had pain from her neck and back and she has not been feeling great. She has appointments with her neurosurgeons coming up. She is not sleeping very well on the Trazodone at 150mg . discussed with her that we had increased her trazodone to 200 mg and patient states that she was not aware of it but when she increased the dose she was doing much better in terms of her sleep. Feels she may need a higher dosage. Eating okay. States that she is looking into taking an online course for elderly care. States that her parents are in their 3670s and this is something she is interested in. Denies any suicidal thoughts.   Chief Complaint    Follow-up; Medication Refill     Visit Diagnosis:     ICD-9-CM ICD-10-CM   1. Major depressive disorder, recurrent episode, moderate (HCC) 296.32 F33.1     Past Medical History:  Past Medical History  Diagnosis Date  . Hypertension   . COPD (chronic obstructive pulmonary disease) (HCC)   . Depression   . Asthma   . Back pain   . PTSD (post-traumatic stress disorder)   . Anxiety   . Headache   . Anemia   . IBS (irritable bowel syndrome)     Past Surgical History  Procedure Laterality Date  . Neck surgery    . Tonsillectomy    . Dilation and curettage of uterus    . 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 Hypertension Mother   . Thyroid disease Mother   . Deep vein thrombosis Father   . Hypertension Father   . Depression Brother   .  Hypertension Brother   . Depression Brother   . Hypertension Brother    Social History:  Social History   Social History  . Marital Status: Married    Spouse Name: N/A  . Number of Children: N/A  . Years of Education: N/A   Social History Main Topics  . Smoking status: Never Smoker   . Smokeless tobacco: Never Used  . Alcohol Use: No  . Drug Use: No  . Sexual Activity: Yes    Birth Control/ Protection: None   Other Topics Concern  . None   Social History Narrative   Additional History:   Assessment:   Musculoskeletal: Strength & Muscle Tone: within normal limits Gait & Station: normal Patient leans: N/A  Psychiatric Specialty Exam: Anxiety Symptoms include insomnia. Patient reports no nervous/anxious behavior or suicidal ideas.    Depression        Associated symptoms include insomnia.  Associated symptoms include no suicidal ideas.  Past medical history includes anxiety.     Review of Systems  Psychiatric/Behavioral: Positive for depression. Negative for suicidal ideas, hallucinations, memory loss and substance abuse. The patient has insomnia. The patient is not nervous/anxious.     Last menstrual period 03/09/2016.There is no weight on file to calculate BMI.  General Appearance: Fairly Groomed  Eye Contact:  Good  Speech:  Normal Rate  Volume:  Normal  Mood:  Slightly improved   Affect:  Constricted   Thought Process:  Linear  Orientation:  Full (Time, Place, and Person)  Thought Content:  Negative  Suicidal Thoughts:  No  Homicidal Thoughts:  No  Memory:  Immediate;   Good Recent;   Good Remote;   Good  Judgement:  Good  Insight:  Good  Psychomotor Activity:  Negative  Concentration:  Good  Recall:  Good  Fund of Knowledge: Good  Language: Good  Akathisia:  Negative  Handed:  Right  AIMS (if indicated):  Not done today  Assets:  Desire for Improvement Social Support  ADL's:  Intact  Cognition: WNL  Sleep:  fair   Is the patient at risk  to self?  No. Has the patient been a risk to self in the past 6 months?  No. Has the patient been a risk to self within the distant past?  No. Is the patient a risk to others?  No. Has the patient been a risk to others in the past 6 months?  No. Has the patient been a risk to others within the distant past?  No.  Current Medications: Current Outpatient Prescriptions  Medication Sig Dispense Refill  . Biotin 5000 MCG TABS Take 5,000 mcg by mouth daily.    . Brexpiprazole (REXULTI) 2 MG TABS Take 2 mg by mouth every morning. 30 tablet 0  . Calcium Citrate-Vitamin D (CALCIUM CITRATE CHEWY BITE) 500-500 MG-UNIT CHEW Chew 500 mg by mouth 3 (three) times daily.    . cetirizine (ZYRTEC) 10 MG tablet Take 10 mg by mouth daily.    . DULoxetine (CYMBALTA) 60 MG capsule Take 1 capsule (60 mg total) by mouth 2 (two) times daily. 60 capsule 1  . EPINEPHrine (EPIPEN IJ) Inject 1 application as directed once as needed (for allergies).    . fluticasone (FLONASE) 50 MCG/ACT nasal spray Place 1 spray into the nose as needed.     . Pediatric Multivit-Minerals-C (COMPLETE MULTI-VITAMIN) CHEW Chew by mouth.    . traZODone (DESYREL) 100 MG tablet Take 2 tablets (200 mg total) by mouth at bedtime as needed for sleep. 60 tablet 1   No current facility-administered medications for this visit.    Medical Decision Making:  Established Problem, Worsening (2), Review of Medication Regimen & Side Effects (2) and Review of New Medication or Change in Dosage (2)  Treatment Plan Summary:Medication management and Plan   Major depressive disorder, recurrent-  Continue her on Cymbalta as above.  Continue Rexulti at  daily Discussed mindfulness based stress reduction with patient and given some resources to help with her trauma and anxiety. Discussed starting therapy and patient is willing to give it a try  Anxiety- Continue with above regimen. Discontinue the xanax.  Insomnia Take Trazodone at  po  qhs.  RTC for follow up in 2 months. She is to call before with any questions  Dariella Gillihan 04/27/2016, 11:22 AM

## 2016-05-25 ENCOUNTER — Ambulatory Visit (INDEPENDENT_AMBULATORY_CARE_PROVIDER_SITE_OTHER): Payer: BLUE CROSS/BLUE SHIELD | Admitting: Licensed Clinical Social Worker

## 2016-05-25 DIAGNOSIS — F331 Major depressive disorder, recurrent, moderate: Secondary | ICD-10-CM
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
## 2016-06-22 ENCOUNTER — Ambulatory Visit: Payer: No Typology Code available for payment source | Admitting: Licensed Clinical Social Worker

## 2016-06-28 ENCOUNTER — Ambulatory Visit: Payer: BLUE CROSS/BLUE SHIELD | Admitting: Psychiatry

## 2016-08-24 ENCOUNTER — Encounter: Payer: Self-pay | Admitting: Psychiatry

## 2016-08-24 ENCOUNTER — Ambulatory Visit (INDEPENDENT_AMBULATORY_CARE_PROVIDER_SITE_OTHER): Payer: BLUE CROSS/BLUE SHIELD | Admitting: Psychiatry

## 2016-08-24 VITALS — BP 137/81 | HR 72 | Temp 97.9°F | Ht 66.0 in | Wt 190.0 lb

## 2016-08-24 DIAGNOSIS — F331 Major depressive disorder, recurrent, moderate: Secondary | ICD-10-CM

## 2016-08-24 MED ORDER — TRAZODONE HCL 100 MG PO TABS: 100.0000 mg | ORAL_TABLET | Freq: Every evening | ORAL | 1 refills | Status: DC | PRN

## 2016-08-24 MED ORDER — DULOXETINE HCL 60 MG PO CPEP: 60.0000 mg | ORAL_CAPSULE | Freq: Two times a day (BID) | ORAL | 1 refills | Status: DC

## 2016-08-24 NOTE — Progress Notes (Signed)
Patient ID: Melinda Harrison, female   DOB: 1966/01/18, 50 y.o.   MRN: [id number]  [facility] MD/PA/NP OP Progress Note  08/24/2016 10:55 AM [address] [place]  MRN:  [id number]   HPI: Patient seen today for follow up of her Depression and PTSD. States she has been doing the same. Feels the rexulti , Has not been helpful, feels it may have been helpful in the beginning but not anymore. Reports having trouble focusing, feels socially anxious and states she does not  Feel motivated many times. Sleeping too much. Endorsing depressed mood and anxiety. Denies any suicidal thoughts. Discussed with patient decreasing her trazodone since she is at 200 mg and she is willing to cut down 100 mg at this time. She would like to get started on Ritalin but given the history of her bariatric surgery discussed this may not be the best option at this time. She has stopped taking the Rexulti about a week ago and we will discontinue that at this time.   Chief Complaint    Follow-up; Medication Refill     Visit Diagnosis:     ICD-9-CM ICD-10-CM   1. Major depressive disorder, recurrent episode, moderate (HCC) 296.32 F33.1     Past Medical History:  Past Medical History:  Diagnosis Date  . Anemia   . Anxiety   . Asthma   . Back pain   . COPD (chronic obstructive pulmonary disease) (HCC)   . Depression   . Headache   . Hypertension   . IBS (irritable bowel syndrome)   . PTSD (post-traumatic stress disorder)     Past Surgical History:  Procedure Laterality Date  . DILATION AND CURETTAGE OF UTERUS    . GASTRIC ROUX-EN-Y N/A 11/23/2015   Procedure: LAPAROSCOPIC ROUX-EN-Y GASTRIC BYPASS, HIATAL HERNIA REPAIR;  Surgeon: Everette Rank, MD;  Location: [facility] ORS;  Service: General;  Laterality: N/A;  . NECK SURGERY    . TONSILLECTOMY     Family History:  Family History  Problem Relation Age of Onset  . Hypertension Mother   . Thyroid disease Mother   . Deep vein thrombosis Father   . Hypertension Father   .  Depression Brother   . Hypertension Brother   . Depression Brother   . Hypertension Brother    Social History:  Social History   Social History  . Marital status: Married    Spouse name: N/A  . Number of children: N/A  . Years of education: N/A   Social History Main Topics  . Smoking status: Never Smoker  . Smokeless tobacco: Never Used  . Alcohol use No  . Drug use: No  . Sexual activity: Yes    Birth control/ protection: None   Other Topics Concern  . None   Social History Narrative  . None   Additional History:   Assessment:   Musculoskeletal: Strength & Muscle Tone: within normal limits Gait & Station: normal Patient leans: N/A  Psychiatric Specialty Exam: Anxiety  Symptoms include insomnia. Patient reports no nervous/anxious behavior or suicidal ideas.    Depression         Associated symptoms include insomnia.  Associated symptoms include no suicidal ideas.  Past medical history includes anxiety.   Medication Refill     Review of Systems  Psychiatric/Behavioral: Positive for depression. Negative for hallucinations, memory loss, substance abuse and suicidal ideas. The patient has insomnia. The patient is not nervous/anxious.     Blood pressure 137/81, pulse 72, temperature 97.9 F (36.6 C),  temperature source Oral, height 5\' 6"  (1.676 m), weight 190 lb (86.2 kg).Body mass index is 30.67 kg/m.  General Appearance: Fairly Groomed  Eye Contact:  Good  Speech:  Normal Rate  Volume:  Normal  Mood:  Slightly improved   Affect:  Constricted   Thought Process:  Linear  Orientation:  Full (Time, Place, and Person)  Thought Content:  Negative  Suicidal Thoughts:  No  Homicidal Thoughts:  No  Memory:  Immediate;   Good Recent;   Good Remote;   Good  Judgement:  Good  Insight:  Good  Psychomotor Activity:  Negative  Concentration:  Good  Recall:  Good  Fund of Knowledge: Good  Language: Good  Akathisia:  Negative  Handed:  Right  AIMS (if  indicated):  Not done today  Assets:  Desire for Improvement Social Support  ADL's:  Intact  Cognition: WNL  Sleep:  fair   Is the patient at risk to self?  No. Has the patient been a risk to self in the past 6 months?  No. Has the patient been a risk to self within the distant past?  No. Is the patient a risk to others?  No. Has the patient been a risk to others in the past 6 months?  No. Has the patient been a risk to others within the distant past?  No.  Current Medications: Current Outpatient Prescriptions  Medication Sig Dispense Refill  . Biotin 5000 MCG TABS Take 5,000 mcg by mouth daily.    . Brexpiprazole (REXULTI) 2 MG TABS Take 2 mg by mouth every morning. 30 tablet 1  . Calcium Citrate-Vitamin D (CALCIUM CITRATE CHEWY BITE) 500-500 MG-UNIT CHEW Chew 500 mg by mouth 3 (three) times daily.    . cetirizine (ZYRTEC) 10 MG tablet Take 10 mg by mouth daily.    . DULoxetine (CYMBALTA) 60 MG capsule Take 1 capsule (60 mg total) by mouth 2 (two) times daily. 60 capsule 1  . EPINEPHrine (EPIPEN IJ) Inject 1 application as directed once as needed (for allergies).    . fluticasone (FLONASE) 50 MCG/ACT nasal spray Place 1 spray into the nose as needed.     . Pediatric Multivit-Minerals-C (COMPLETE MULTI-VITAMIN) CHEW Chew by mouth.    . traZODone (DESYREL) 100 MG tablet Take 2 tablets (200 mg total) by mouth at bedtime as needed for sleep. 60 tablet 1   No current facility-administered medications for this visit.     Medical Decision Making:  Established Problem, Worsening (2), Review of Medication Regimen & Side Effects (2) and Review of New Medication or Change in Dosage (2)  Treatment Plan Summary:Medication management and Plan   Major depressive disorder, recurrent-  Continue her on Cymbalta as above.  Discontinue Rexulti. Discussed patient needs to see a therapist on a regular basis. Is willing to make another appointment.  Anxiety- Continue with above  regimen.  Insomnia  Decrease Trazodone at 100mg  po qhs.  RTC for follow up in 2 months. She is to call before with any questions. Patient aware that it would be better for her to be seen more more frequent intervals but states that finances are a problem and she will call if anything comes up.  Dealva Lafoy 08/24/2016, 10:55 AM

## 2016-09-29 ENCOUNTER — Other Ambulatory Visit: Payer: Self-pay | Admitting: Neurosurgery

## 2016-09-29 DIAGNOSIS — M4316 Spondylolisthesis, lumbar region: Secondary | ICD-10-CM

## 2016-09-29 DIAGNOSIS — S12001D Unspecified nondisplaced fracture of first cervical vertebra, subsequent encounter for fracture with routine healing: Secondary | ICD-10-CM

## 2016-09-29 DIAGNOSIS — S22000S Wedge compression fracture of unspecified thoracic vertebra, sequela: Secondary | ICD-10-CM

## 2016-09-29 DIAGNOSIS — G44321 Chronic post-traumatic headache, intractable: Secondary | ICD-10-CM

## 2016-10-05 ENCOUNTER — Other Ambulatory Visit: Payer: BLUE CROSS/BLUE SHIELD

## 2016-10-11 ENCOUNTER — Other Ambulatory Visit: Payer: Self-pay | Admitting: Neurosurgery

## 2016-10-11 DIAGNOSIS — S22000S Wedge compression fracture of unspecified thoracic vertebra, sequela: Secondary | ICD-10-CM

## 2016-10-11 DIAGNOSIS — G44321 Chronic post-traumatic headache, intractable: Secondary | ICD-10-CM

## 2016-10-11 DIAGNOSIS — S12001D Unspecified nondisplaced fracture of first cervical vertebra, subsequent encounter for fracture with routine healing: Secondary | ICD-10-CM

## 2016-10-11 DIAGNOSIS — M4316 Spondylolisthesis, lumbar region: Secondary | ICD-10-CM

## 2016-10-17 ENCOUNTER — Ambulatory Visit
Admission: RE | Admit: 2016-10-17 | Discharge: 2016-10-17 | Disposition: A | Discharge status: Home / Self Care | Payer: BLUE CROSS/BLUE SHIELD | Source: Ambulatory Visit | Attending: Neurosurgery | Admitting: Neurosurgery

## 2016-10-17 DIAGNOSIS — S22000S Wedge compression fracture of unspecified thoracic vertebra, sequela: Secondary | ICD-10-CM

## 2016-10-17 DIAGNOSIS — S12001D Unspecified nondisplaced fracture of first cervical vertebra, subsequent encounter for fracture with routine healing: Secondary | ICD-10-CM

## 2016-10-17 DIAGNOSIS — M4316 Spondylolisthesis, lumbar region: Secondary | ICD-10-CM

## 2016-10-17 DIAGNOSIS — G44321 Chronic post-traumatic headache, intractable: Secondary | ICD-10-CM

## 2016-10-18 ENCOUNTER — Ambulatory Visit
Admission: RE | Admit: 2016-10-18 | Discharge: 2016-10-18 | Disposition: A | Discharge status: Home / Self Care | Payer: BLUE CROSS/BLUE SHIELD | Source: Ambulatory Visit | Attending: Neurosurgery | Admitting: Neurosurgery

## 2016-10-18 DIAGNOSIS — G44321 Chronic post-traumatic headache, intractable: Secondary | ICD-10-CM

## 2016-10-18 DIAGNOSIS — M4316 Spondylolisthesis, lumbar region: Secondary | ICD-10-CM

## 2016-10-18 DIAGNOSIS — S22000S Wedge compression fracture of unspecified thoracic vertebra, sequela: Secondary | ICD-10-CM

## 2016-10-18 DIAGNOSIS — S12001D Unspecified nondisplaced fracture of first cervical vertebra, subsequent encounter for fracture with routine healing: Secondary | ICD-10-CM

## 2016-10-24 ENCOUNTER — Ambulatory Visit: Payer: Self-pay | Admitting: Psychiatry

## 2016-11-07 ENCOUNTER — Encounter: Payer: Self-pay | Admitting: Psychiatry

## 2016-11-07 ENCOUNTER — Ambulatory Visit (INDEPENDENT_AMBULATORY_CARE_PROVIDER_SITE_OTHER): Payer: BLUE CROSS/BLUE SHIELD | Admitting: Psychiatry

## 2016-11-07 VITALS — BP 144/88 | HR 84 | Temp 97.5°F | Resp 17 | Wt 180.6 lb

## 2016-11-07 DIAGNOSIS — F331 Major depressive disorder, recurrent, moderate: Secondary | ICD-10-CM | POA: Diagnosis not present

## 2016-11-07 MED ORDER — TRAZODONE HCL 100 MG PO TABS: 200.0000 mg | ORAL_TABLET | Freq: Every evening | ORAL | 2 refills | Status: DC | PRN

## 2016-11-07 MED ORDER — DULOXETINE HCL 60 MG PO CPEP: 60.0000 mg | ORAL_CAPSULE | Freq: Two times a day (BID) | ORAL | 1 refills | Status: DC

## 2016-11-07 NOTE — Progress Notes (Signed)
Patient ID: Melinda Harrison, female   DOB: 05-Jun-1966, 50 y.o.   MRN: [id number]  [facility] OP Progress Note  11/07/2016 1:49 PM Melinda Harrison  MRN:  [id number]   HPI: Patient seen today for follow up of her Depression and PTSD. States she has been doing the same. Patient reports being very overwhelmed by her headaches. States that they're constant all the time and she has not been able to do anything. States that she just lays in bed all day. She recently had an MRI and has an appointment with her neurosurgeon on the 21st. Discussed her starting to see a therapist to help her. However she reports that is not a possibility. States she has financial issues. She denies any suicidal thoughts. She denies that she'll benefit from hospitalization to help her medication management. States the Cymbalta helps mildly.  Chief Complaint    Follow-up     Visit Diagnosis:     ICD-9-CM ICD-10-CM   1. Major depressive disorder, recurrent episode, moderate (HCC) 296.32 F33.1     Past Medical History:  Past Medical History:  Diagnosis Date  . Anemia   . Anxiety   . Asthma   . Back pain   . COPD (chronic obstructive pulmonary disease) (HCC)   . Depression   . Headache   . Hypertension   . IBS (irritable bowel syndrome)   . PTSD (post-traumatic stress disorder)     Past Surgical History:  Procedure Laterality Date  . DILATION AND CURETTAGE OF UTERUS    . GASTRIC ROUX-EN-Y N/A 11/23/2015   Procedure: LAPAROSCOPIC ROUX-EN-Y GASTRIC BYPASS, HIATAL HERNIA REPAIR;  Surgeon: Everette RankMichael A Tyner, MD;  Location: [facility] ORS;  Service: General;  Laterality: N/A;  . NECK SURGERY    . TONSILLECTOMY     Family History:  Family History  Problem Relation Age of Onset  . Hypertension Mother   . Thyroid disease Mother   . Deep vein thrombosis Father   . Hypertension Father   . Depression Brother   . Hypertension Brother   . Depression Brother   . Hypertension Brother    Social History:  Social History    Social History  . Marital status: Married    Spouse name: N/A  . Number of children: N/A  . Years of education: N/A   Social History Main Topics  . Smoking status: Never Smoker  . Smokeless tobacco: Never Used  . Alcohol use No  . Drug use: No  . Sexual activity: Yes    Birth control/ protection: None   Other Topics Concern  . None   Social History Narrative  . None   Additional History:   Assessment:   Musculoskeletal: Strength & Muscle Tone: within normal limits Gait & Station: normal Patient leans: N/A  Psychiatric Specialty Exam: Medication Refill   Anxiety  Symptoms include insomnia. Patient reports no nervous/anxious behavior or suicidal ideas.    Depression         Associated symptoms include insomnia.  Associated symptoms include no suicidal ideas.  Past medical history includes anxiety.     Review of Systems  Psychiatric/Behavioral: Positive for depression. Negative for hallucinations, memory loss, substance abuse and suicidal ideas. The patient has insomnia. The patient is not nervous/anxious.     Blood pressure (!) 144/88, pulse 84, temperature 97.5 F (36.4 C), temperature source Tympanic, resp. rate 17, weight 180 lb 9.6 oz (81.9 kg), SpO2 98 %.Body mass index is 29.15 kg/m.  General Appearance: Fairly Groomed  Eye Contact:  Good  Speech:  Normal Rate  Volume:  Normal  Mood:  Not doing well   Affect:  Constricted   Thought Process:  Linear  Orientation:  Full (Time, Place, and Person)  Thought Content:  Negative  Suicidal Thoughts:  No  Homicidal Thoughts:  No  Memory:  Immediate;   Good Recent;   Good Remote;   Good  Judgement:  Good  Insight:  Good  Psychomotor Activity:  Negative  Concentration:  Good  Recall:  Good  Fund of Knowledge: Good  Language: Good  Akathisia:  Negative  Handed:  Right  AIMS (if indicated):  Not done today  Assets:  Desire for Improvement Social Support  ADL's:  Intact  Cognition: WNL  Sleep:  Poor     Is the patient at risk to self?  No. Has the patient been a risk to self in the past 6 months?  No. Has the patient been a risk to self within the distant past?  No. Is the patient a risk to others?  No. Has the patient been a risk to others in the past 6 months?  No. Has the patient been a risk to others within the distant past?  No.  Current Medications: Current Outpatient Prescriptions  Medication Sig Dispense Refill  . Biotin 5000 MCG TABS Take 5,000 mcg by mouth daily.    . Calcium Citrate-Vitamin D (CALCIUM CITRATE CHEWY BITE) 500-500 MG-UNIT CHEW Chew 500 mg by mouth 3 (three) times daily.    . cetirizine (ZYRTEC) 10 MG tablet Take 10 mg by mouth daily.    . DULoxetine (CYMBALTA) 60 MG capsule Take 1 capsule (60 mg total) by mouth 2 (two) times daily. 60 capsule 1  . EPINEPHrine (EPIPEN IJ) Inject 1 application as directed once as needed (for allergies).    . fluticasone (FLONASE) 50 MCG/ACT nasal spray Place 1 spray into the nose as needed.     . Pediatric Multivit-Minerals-C (COMPLETE MULTI-VITAMIN) CHEW Chew by mouth.    . traZODone (DESYREL) 100 MG tablet Take 2 tablets (200 mg total) by mouth at bedtime as needed for sleep. 60 tablet 2   No current facility-administered medications for this visit.     Medical Decision Making:  Established Problem, Worsening (2), Review of Medication Regimen & Side Effects (2) and Review of New Medication or Change in Dosage (2)  Treatment Plan Summary:Medication management and Plan   Major depressive disorder, recurrent-  Continue her on Cymbalta as above.  Discussed patient needs to see a therapist on a regular basis. Patient not interested  Anxiety- Continue with above regimen.  Insomnia Increase Trazodone to 200mg  po qhs.  RTC for follow up in 1 months. She is to call before with any questions. Patient aware that it would be better for her to be seen more more frequent intervals but states that finances are a problem and she  will call if anything comes up.  Enisa Runyan 11/07/2016, 1:49 PM

## 2016-12-07 ENCOUNTER — Ambulatory Visit (INDEPENDENT_AMBULATORY_CARE_PROVIDER_SITE_OTHER): Payer: BLUE CROSS/BLUE SHIELD | Admitting: Psychiatry

## 2016-12-07 ENCOUNTER — Encounter: Payer: Self-pay | Admitting: Psychiatry

## 2016-12-07 VITALS — BP 119/83 | HR 88 | Temp 98.7°F | Wt 175.2 lb

## 2016-12-07 DIAGNOSIS — F331 Major depressive disorder, recurrent, moderate: Secondary | ICD-10-CM

## 2016-12-07 MED ORDER — DULOXETINE HCL 60 MG PO CPEP: 60.0000 mg | ORAL_CAPSULE | Freq: Two times a day (BID) | ORAL | 2 refills | Status: AC

## 2016-12-07 MED ORDER — TRAZODONE HCL 100 MG PO TABS: 200.0000 mg | ORAL_TABLET | Freq: Every evening | ORAL | 2 refills | Status: AC | PRN

## 2016-12-07 NOTE — Progress Notes (Signed)
Patient ID: Melinda Harrison, female   DOB: Mar 07, 1966, 50 y.o.   MRN: [id number]  [facility]  12/07/2016 1:10 PM Melinda Harrison  MRN:  [id number]   HPI: Patient seen today for follow up of her Depression and PTSD. States she has been doing the same. She is sleeping better on the higher dose of the Trazodone. Reports her mood is the same. Patient interested in obtaining disability for her Depression and PTSD. She was told this clinician does not do disability paperwork. Discussed adding other medications and strategies to help with her depression but patient is currently not interested. She states that the only medication she had relief from was with either Adderall or Ritalin. Discussed with her that that's not the appropriate medication for her depression currently Denies active suicidal thoughts.  Chief Complaint    Follow-up; Medication Refill     Visit Diagnosis:     ICD-9-CM ICD-10-CM   1. Major depressive disorder, recurrent episode, moderate (HCC) 296.32 F33.1     Past Medical History:  Past Medical History:  Diagnosis Date  . Anemia   . Anxiety   . Asthma   . Back pain   . COPD (chronic obstructive pulmonary disease) (HCC)   . Depression   . Headache   . Hypertension   . IBS (irritable bowel syndrome)   . PTSD (post-traumatic stress disorder)     Past Surgical History:  Procedure Laterality Date  . DILATION AND CURETTAGE OF UTERUS    . GASTRIC ROUX-EN-Y N/A 11/23/2015   Procedure: LAPAROSCOPIC ROUX-EN-Y GASTRIC BYPASS, HIATAL HERNIA REPAIR;  Surgeon: Everette RankMichael A Tyner, MD;  Location: [facility] ORS;  Service: General;  Laterality: N/A;  . NECK SURGERY    . TONSILLECTOMY     Family History:  Family History  Problem Relation Age of Onset  . Hypertension Mother   . Thyroid disease Mother   . Deep vein thrombosis Father   . Hypertension Father   . Depression Brother   . Hypertension Brother   . Depression Brother   . Hypertension Brother    Social  History:  Social History   Social History  . Marital status: Married    Spouse name: N/A  . Number of children: N/A  . Years of education: N/A   Social History Main Topics  . Smoking status: Never Smoker  . Smokeless tobacco: Never Used  . Alcohol use No  . Drug use: No  . Sexual activity: Yes    Birth control/ protection: None   Other Topics Concern  . None   Social History Narrative  . None   Additional History:   Assessment:   Musculoskeletal: Strength & Muscle Tone: within normal limits Gait & Station: normal Patient leans: N/A  Psychiatric Specialty Exam: Medication Refill   Anxiety  Symptoms include insomnia. Patient reports no nervous/anxious behavior or suicidal ideas.    Depression         Associated symptoms include insomnia.  Associated symptoms include no suicidal ideas.  Past medical history includes anxiety.     Review of Systems  Psychiatric/Behavioral: Positive for depression. Negative for hallucinations, memory loss, substance abuse and suicidal ideas. The patient has insomnia. The patient is not nervous/anxious.     There were no vitals taken for this visit.There is no height or weight on file to calculate BMI.  General Appearance: Fairly Groomed  Eye Contact:  Good  Speech:  Normal Rate  Volume:  Normal  Mood:  Doing okay  Affect:  Constricted   Thought Process:  Linear  Orientation:  Full (Time, Place, and Person)  Thought Content:  Negative  Suicidal Thoughts:  No  Homicidal Thoughts:  No  Memory:  Immediate;   Good Recent;   Good Remote;   Good  Judgement:  Good  Insight:  Good  Psychomotor Activity:  Negative  Concentration:  Good  Recall:  Good  Fund of Knowledge: Good  Language: Good  Akathisia:  Negative  Handed:  Right  AIMS (if indicated):  Not done today  Assets:  Desire for Improvement Social Support  ADL's:  Intact  Cognition: WNL  Sleep:  better    Is the patient at risk to self?  No. Has the patient been a  risk to self in the past 6 months?  No. Has the patient been a risk to self within the distant past?  No. Is the patient a risk to others?  No. Has the patient been a risk to others in the past 6 months?  No. Has the patient been a risk to others within the distant past?  No.  Current Medications: Current Outpatient Prescriptions  Medication Sig Dispense Refill  . Biotin 5000 MCG TABS Take 5,000 mcg by mouth daily.    . Calcium Citrate-Vitamin D (CALCIUM CITRATE CHEWY BITE) 500-500 MG-UNIT CHEW Chew 500 mg by mouth 3 (three) times daily.    . cetirizine (ZYRTEC) 10 MG tablet Take 10 mg by mouth daily.    . DULoxetine (CYMBALTA) 60 MG capsule Take 1 capsule (60 mg total) by mouth 2 (two) times daily. 60 capsule 1  . EPINEPHrine (EPIPEN IJ) Inject 1 application as directed once as needed (for allergies).    . fluticasone (FLONASE) 50 MCG/ACT nasal spray Place 1 spray into the nose as needed.     . Pediatric Multivit-Minerals-C (COMPLETE MULTI-VITAMIN) CHEW Chew by mouth.    . traZODone (DESYREL) 100 MG tablet Take 2 tablets (200 mg total) by mouth at bedtime as needed for sleep. 60 tablet 2   No current facility-administered medications for this visit.     Medical Decision Making:  Established Problem, Worsening (2), Review of Medication Regimen & Side Effects (2) and Review of New Medication or Change in Dosage (2)  Treatment Plan Summary:Medication management and Plan   Major depressive disorder, recurrent-  Continue her on Cymbalta as above.  Discussed patient needs to see a therapist on a regular basis. Patient not interested  Anxiety- Continue with above regimen.  Insomnia Continue Trazodone at 200mg  po qhs.  Patient would like to transition her psychiatric care to different clinician to be able to obtain disability. [facility]. Patient is a agreeable to this plan. She was given 3  months worth of medications and will transition to a different provider.  Kyelle Urbas 12/07/2016, 1:10 PM

## 2018-03-12 ENCOUNTER — Other Ambulatory Visit: Payer: Self-pay | Admitting: Orthopedic Surgery

## 2018-03-12 DIAGNOSIS — M545 Low back pain: Secondary | ICD-10-CM

## 2018-03-12 DIAGNOSIS — M542 Cervicalgia: Secondary | ICD-10-CM

## 2018-03-17 ENCOUNTER — Ambulatory Visit
Admission: RE | Admit: 2018-03-17 | Discharge: 2018-03-17 | Disposition: A | Discharge status: Home / Self Care | Payer: BLUE CROSS/BLUE SHIELD | Source: Ambulatory Visit | Attending: Orthopedic Surgery | Admitting: Orthopedic Surgery

## 2018-03-17 ENCOUNTER — Other Ambulatory Visit: Payer: BLUE CROSS/BLUE SHIELD

## 2018-03-17 DIAGNOSIS — M545 Low back pain: Secondary | ICD-10-CM

## 2018-03-17 DIAGNOSIS — M542 Cervicalgia: Secondary | ICD-10-CM

## 2018-06-06 ENCOUNTER — Other Ambulatory Visit: Payer: Self-pay | Admitting: Orthopedic Surgery

## 2018-06-10 ENCOUNTER — Telehealth: Payer: Self-pay | Admitting: Podiatry

## 2018-06-10 NOTE — Telephone Encounter (Signed)
Pt stated that she needs Dr. Al CorpusHyatt to giver her a call.

## 2018-06-20 ENCOUNTER — Inpatient Hospital Stay (HOSPITAL_COMMUNITY)
Admission: RE | Admit: 2018-06-20 | Payer: BLUE CROSS/BLUE SHIELD | Source: Ambulatory Visit | Admitting: Orthopedic Surgery

## 2018-06-20 ENCOUNTER — Encounter (HOSPITAL_COMMUNITY): Admission: RE | Payer: Self-pay | Source: Ambulatory Visit

## 2018-06-20 SURGERY — TRANSFORAMINAL LUMBAR INTERBODY FUSION (TLIF) WITH PEDICLE SCREW FIXATION 1 LEVEL
Anesthesia: General

## 2019-01-08 ENCOUNTER — Encounter: Payer: Self-pay | Admitting: Obstetrics and Gynecology

## 2019-01-08 ENCOUNTER — Ambulatory Visit (INDEPENDENT_AMBULATORY_CARE_PROVIDER_SITE_OTHER): Payer: BLUE CROSS/BLUE SHIELD | Admitting: Obstetrics and Gynecology

## 2019-01-08 VITALS — BP 142/85 | HR 77 | Ht 66.0 in | Wt 185.2 lb

## 2019-01-08 DIAGNOSIS — Z1211 Encounter for screening for malignant neoplasm of colon: Secondary | ICD-10-CM | POA: Diagnosis not present

## 2019-01-08 DIAGNOSIS — R399 Unspecified symptoms and signs involving the genitourinary system: Secondary | ICD-10-CM | POA: Diagnosis not present

## 2019-01-08 DIAGNOSIS — Z01419 Encounter for gynecological examination (general) (routine) without abnormal findings: Secondary | ICD-10-CM

## 2019-01-08 DIAGNOSIS — Z86718 Personal history of other venous thrombosis and embolism: Secondary | ICD-10-CM | POA: Insufficient documentation

## 2019-01-08 DIAGNOSIS — N951 Menopausal and female climacteric states: Secondary | ICD-10-CM

## 2019-01-08 MED ORDER — CIPROFLOXACIN HCL 500 MG PO TABS: 500.0000 mg | ORAL_TABLET | Freq: Two times a day (BID) | ORAL | 0 refills | Status: DC

## 2019-01-08 NOTE — Patient Instructions (Signed)
Health Maintenance for Postmenopausal Women Menopause is a normal process in which your reproductive ability comes to an end. This process happens gradually over a span of months to years, usually between the ages of 62 and 89. Menopause is complete when you have missed 12 consecutive menstrual periods. It is important to talk with your health care provider about some of the most common conditions that affect postmenopausal women, such as heart disease, cancer, and bone loss (osteoporosis). Adopting a healthy lifestyle and getting preventive care can help to promote your health and wellness. Those actions can also lower your chances of developing some of these common conditions. What should I know about menopause? During menopause, you may experience a number of symptoms, such as:  Moderate-to-severe hot flashes.  Night sweats.  Decrease in sex drive.  Mood swings.  Headaches.  Tiredness.  Irritability.  Memory problems.  Insomnia. Choosing to treat or not to treat menopausal changes is an individual decision that you make with your health care provider. What should I know about hormone replacement therapy and supplements? Hormone therapy products are effective for treating symptoms that are associated with menopause, such as hot flashes and night sweats. Hormone replacement carries certain risks, especially as you become older. If you are thinking about using estrogen or estrogen with progestin treatments, discuss the benefits and risks with your health care provider. What should I know about heart disease and stroke? Heart disease, heart attack, and stroke become more likely as you age. This may be due, in part, to the hormonal changes that your body experiences during menopause. These can affect how your body processes dietary fats, triglycerides, and cholesterol. Heart attack and stroke are both medical emergencies. There are many things that you can do to help prevent heart disease  and stroke:  Have your blood pressure checked at least every 1-2 years. High blood pressure causes heart disease and increases the risk of stroke.  If you are 79-72 years old, ask your health care provider if you should take aspirin to prevent a heart attack or a stroke.  Do not use any tobacco products, including cigarettes, chewing tobacco, or electronic cigarettes. If you need help quitting, ask your health care provider.  It is important to eat a healthy diet and maintain a healthy weight. ? Be sure to include plenty of vegetables, fruits, low-fat dairy products, and lean protein. ? Avoid eating foods that are high in solid fats, added sugars, or salt (sodium).  Get regular exercise. This is one of the most important things that you can do for your health. ? Try to exercise for at least 150 minutes each week. The type of exercise that you do should increase your heart rate and make you sweat. This is known as moderate-intensity exercise. ? Try to do strengthening exercises at least twice each week. Do these in addition to the moderate-intensity exercise.  Know your numbers.Ask your health care provider to check your cholesterol and your blood glucose. Continue to have your blood tested as directed by your health care provider.  What should I know about cancer screening? There are several types of cancer. Take the following steps to reduce your risk and to catch any cancer development as early as possible. Breast Cancer  Practice breast self-awareness. ? This means understanding how your breasts normally appear and feel. ? It also means doing regular breast self-exams. Let your health care provider know about any changes, no matter how small.  If you are 40 or  older, have a clinician do a breast exam (clinical breast exam or CBE) every year. Depending on your age, family history, and medical history, it may be recommended that you also have a yearly breast X-ray (mammogram).  If you  have a family history of breast cancer, talk with your health care provider about genetic screening.  If you are at high risk for breast cancer, talk with your health care provider about having an MRI and a mammogram every year.  Breast cancer (BRCA) gene test is recommended for women who have family members with BRCA-related cancers. Results of the assessment will determine the need for genetic counseling and BRCA1 and for BRCA2 testing. BRCA-related cancers include these types: ? Breast. This occurs in males or females. ? Ovarian. ? Tubal. This may also be called fallopian tube cancer. ? Cancer of the abdominal or pelvic lining (peritoneal cancer). ? Prostate. ? Pancreatic. Cervical, Uterine, and Ovarian Cancer Your health care provider may recommend that you be screened regularly for cancer of the pelvic organs. These include your ovaries, uterus, and vagina. This screening involves a pelvic exam, which includes checking for microscopic changes to the surface of your cervix (Pap test).  For women ages 21-65, health care providers may recommend a pelvic exam and a Pap test every three years. For women ages 39-65, they may recommend the Pap test and pelvic exam, combined with testing for human papilloma virus (HPV), every five years. Some types of HPV increase your risk of cervical cancer. Testing for HPV may also be done on women of any age who have unclear Pap test results.  Other health care providers may not recommend any screening for nonpregnant women who are considered low risk for pelvic cancer and have no symptoms. Ask your health care provider if a screening pelvic exam is right for you.  If you have had past treatment for cervical cancer or a condition that could lead to cancer, you need Pap tests and screening for cancer for at least 20 years after your treatment. If Pap tests have been discontinued for you, your risk factors (such as having a new sexual partner) need to be reassessed  to determine if you should start having screenings again. Some women have medical problems that increase the chance of getting cervical cancer. In these cases, your health care provider may recommend that you have screening and Pap tests more often.  If you have a family history of uterine cancer or ovarian cancer, talk with your health care provider about genetic screening.  If you have vaginal bleeding after reaching menopause, tell your health care provider.  There are currently no reliable tests available to screen for ovarian cancer. Lung Cancer Lung cancer screening is recommended for adults 57-50 years old who are at high risk for lung cancer because of a history of smoking. A yearly low-dose CT scan of the lungs is recommended if you:  Currently smoke.  Have a history of at least 30 pack-years of smoking and you currently smoke or have quit within the past 15 years. A pack-year is smoking an average of one pack of cigarettes per day for one year. Yearly screening should:  Continue until it has been 15 years since you quit.  Stop if you develop a health problem that would prevent you from having lung cancer treatment. Colorectal Cancer  This type of cancer can be detected and can often be prevented.  Routine colorectal cancer screening usually begins at age 12 and continues through  age 75.  If you have risk factors for colon cancer, your health care provider may recommend that you be screened at an earlier age.  If you have a family history of colorectal cancer, talk with your health care provider about genetic screening.  Your health care provider may also recommend using home test kits to check for hidden blood in your stool.  A small camera at the end of a tube can be used to examine your colon directly (sigmoidoscopy or colonoscopy). This is done to check for the earliest forms of colorectal cancer.  Direct examination of the colon should be repeated every 5-10 years until  age 75. However, if early forms of precancerous polyps or small growths are found or if you have a family history or genetic risk for colorectal cancer, you may need to be screened more often. Skin Cancer  Check your skin from head to toe regularly.  Monitor any moles. Be sure to tell your health care provider: ? About any new moles or changes in moles, especially if there is a change in a mole's shape or color. ? If you have a mole that is larger than the size of a pencil eraser.  If any of your family members has a history of skin cancer, especially at a young age, talk with your health care provider about genetic screening.  Always use sunscreen. Apply sunscreen liberally and repeatedly throughout the day.  Whenever you are outside, protect yourself by wearing long sleeves, pants, a wide-brimmed hat, and sunglasses. What should I know about osteoporosis? Osteoporosis is a condition in which bone destruction happens more quickly than new bone creation. After menopause, you may be at an increased risk for osteoporosis. To help prevent osteoporosis or the bone fractures that can happen because of osteoporosis, the following is recommended:  If you are 19-50 years old, get at least 1,000 mg of calcium and at least 600 mg of vitamin D per day.  If you are older than age 50 but younger than age 70, get at least 1,200 mg of calcium and at least 600 mg of vitamin D per day.  If you are older than age 70, get at least 1,200 mg of calcium and at least 800 mg of vitamin D per day. Smoking and excessive alcohol intake increase the risk of osteoporosis. Eat foods that are rich in calcium and vitamin D, and do weight-bearing exercises several times each week as directed by your health care provider. What should I know about how menopause affects my mental health? Depression may occur at any age, but it is more common as you become older. Common symptoms of depression include:  Low or sad  mood.  Changes in sleep patterns.  Changes in appetite or eating patterns.  Feeling an overall lack of motivation or enjoyment of activities that you previously enjoyed.  Frequent crying spells. Talk with your health care provider if you think that you are experiencing depression. What should I know about immunizations? It is important that you get and maintain your immunizations. These include:  Tetanus, diphtheria, and pertussis (Tdap) booster vaccine.  Influenza every year before the flu season begins.  Pneumonia vaccine.  Shingles vaccine. Your health care provider may also recommend other immunizations. This information is not intended to replace advice given to you by your health care provider. Make sure you discuss any questions you have with your health care provider. Document Released: 02/02/2006 Document Revised: 06/30/2016 Document Reviewed: 09/14/2015 Elsevier Interactive Patient Education    What should I know about immunizations?  It is important that you get and maintain your immunizations. These include:   Tetanus, diphtheria, and pertussis (Tdap) booster vaccine.   Influenza every year before the flu season begins.   Pneumonia vaccine.   Shingles vaccine.  Your health care provider may also recommend other immunizations.  This information is not intended to replace advice given to you by your health care provider. Make sure you discuss any questions you have with your health care provider.  Document Released: 02/02/2006 Document Revised: 06/30/2016 Document Reviewed: 09/14/2015  [employer] Interactive Patient Education  2019 [employer]  Atrophic Vaginitis    Atrophic vaginitis is a condition in which the tissues that line the vagina become dry and thin. This condition is most common in women who have stopped having regular menstrual periods (are in menopause). This usually starts when a woman is 45-55 years old. That is the time when a woman's estrogen levels begin to drop (decrease).  Estrogen is a female hormone. It helps to keep the tissues of the vagina moist. It stimulates the vagina to produce a clear fluid that lubricates the vagina for sexual intercourse. This fluid also protects the vagina from infection. Lack of estrogen can cause the lining of the vagina to get thinner and dryer. The vagina may also shrink in size. It may become less elastic. Atrophic vaginitis tends to get worse over time as a woman's estrogen level drops.  What are the causes?  This condition is caused by the normal drop in estrogen that happens around the time of menopause.  What increases the risk?  Certain conditions or  situations may lower a woman's estrogen level, leading to a higher risk for atrophic vaginitis. You are more likely to develop this condition if:   You are taking medicines that block estrogen.   You have had your ovaries removed.   You are being treated for cancer with X-ray (radiation) or medicines (chemotherapy).   You have given birth or are breastfeeding.   You are older than age 50.   You smoke.  What are the signs or symptoms?  Symptoms of this condition include:   Pain, soreness, or bleeding during sexual intercourse (dyspareunia).   Vaginal burning, irritation, or itching.   Pain or bleeding when a speculum is used in a vaginal exam (pelvic exam).   Having burning pain when passing urine.   Vaginal discharge that is brown or yellow.  In some cases, there are no symptoms.  How is this diagnosed?  This condition is diagnosed by taking a medical history and doing a physical exam. This will include a pelvic exam that checks the vaginal tissues. Though rare, you may also have other tests, including:   A urine test.   A test that checks the acid balance in your vagina (acid balance test).  How is this treated?  Treatment for this condition depends on how severe your symptoms are. Treatment may include:   Using an over-the-counter vaginal lubricant before sex.   Using a long-acting vaginal moisturizer.   Using low-dose vaginal estrogen for moderate to severe symptoms that do not respond to other treatments. Options include creams, tablets, and inserts (vaginal rings). Before you use a vaginal estrogen, tell your health care provider if you have a history of:  ? Breast cancer.  ? Endometrial cancer.  ? Blood clots.  If you are not sexually active and your symptoms are very mild, you may not need treatment.  Follow   these instructions at home:  Medicines   Take over-the-counter and prescription medicines only as told by your health care provider. Do not use herbal or alternative medicines unless your  health care provider says that you can.   Use over-the-counter creams, lubricants, or moisturizers for dryness only as directed by your health care provider.  General instructions   If your atrophic vaginitis is caused by menopause, discuss all of your menopause symptoms and treatment options with your health care provider.   Do not douche.   Do not use products that can make your vagina dry. These include:  ? Scented feminine sprays.  ? Scented tampons.  ? Scented soaps.   Vaginal intercourse can help to improve blood flow and elasticity of vaginal tissue. If it hurts to have sex, try using a lubricant or moisturizer just before having intercourse.  Contact a health care provider if:   Your discharge looks different than normal.   Your vagina has an unusual smell.   You have new symptoms.   Your symptoms do not improve with treatment.   Your symptoms get worse.  Summary   Atrophic vaginitis is a condition in which the tissues that line the vagina become dry and thin. It is most common in women who have stopped having regular menstrual periods (are in menopause).   Treatment options include using vaginal lubricants and low-dose vaginal estrogen.   Contact a health care provider if your vagina has an unusual smell, or if your symptoms get worse or do not improve after treatment.  This information is not intended to replace advice given to you by your health care provider. Make sure you discuss any questions you have with your health care provider.  Document Released: 04/27/2015 Document Revised: 09/06/2017 Document Reviewed: 09/06/2017  [employer] Interactive Patient Education  2019 [employer]

## 2019-01-08 NOTE — Progress Notes (Signed)
NP to est care. Pt stated that she is having uti symptoms checked her urine at work and was given medication for it by a provider, but pt thinks she still have the symptoms. Pt also stated that she has bump on the outside of her vaginal area x 1 month, no pain from the bump.

## 2019-01-08 NOTE — Progress Notes (Signed)
ANNUAL PREVENTATIVE CARE GYNECOLOGY  ENCOUNTER NOTE  Subjective:       Melinda Harrison is a 53 y.o. G18P1001 female here for a routine annual gynecologic exam. Notes that she has not seen a Theatre manager in 3 years (previously seen at [place] [facility]). The patient is sexually active. The patient is not currently taking hormone replacement therapy. Patient denies post-menopausal vaginal bleeding. The patient wears seatbelts: yes. The patient participates in regular exercise: no. Has the patient ever been transfused or tattooed?: no. The patient reports that there is not domestic violence in her life.  Current complaints: 1.  Notes that she is having UTI symptoms (dark color, odor, and a heaviness in her lower abdomen).  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  Notes that she is drinking plenty of water.    2. Patient also notes a small bump on the outside of her vagina (right sided) that has been there for a month. Denies pain or discharge from the area.    Gynecologic History Patient's last menstrual period was 03/09/2016. Contraception: post menopausal status Last Pap: 2017. Results were: normal.  No prior history of abnormal pap smears.  Last mammogram: 12/2016 (diagnostic, performed at [facility]). Results were: normal.  Scheduled for next mammogram this week.  Last Colonoscopy: Patient had hemoccult screening in 2017. Colonoscopy in 2008. PCP: Franco Nones, FNP. [facility]    Obstetric History OB History  Gravida Para Term Preterm AB Living  1 1 1     1   SAB TAB Ectopic Multiple Live Births          1    # Outcome Date GA Lbr Len/2nd Weight Sex Delivery Anes PTL Lv  1 Term 60    F Vag-Spont   LIV    Past Medical History:  Diagnosis Date  . Anemia   . Anxiety   . Asthma   . Back pain   . COPD (chronic  obstructive pulmonary disease) (HCC)   . Depression   . Headache   . History of DVT (deep vein thrombosis)    Occurred in 2017  . Hypertension   . IBS (irritable bowel syndrome)   . PTSD (post-traumatic stress disorder)   . PTSD (post-traumatic stress disorder)     Family History  Problem Relation Age of Onset  . Hypertension Mother   . Thyroid disease Mother   . Heart disease Mother   . Heart failure Mother   . Deep vein thrombosis Father   . Hypertension Father   . Depression Brother   . Hypertension Brother   . Depression Brother   . Hypertension Brother     Past Surgical History:  Procedure Laterality Date  . DILATION AND CURETTAGE OF UTERUS    . GASTRIC ROUX-EN-Y N/A 11/23/2015   Procedure: LAPAROSCOPIC ROUX-EN-Y GASTRIC BYPASS, HIATAL HERNIA REPAIR;  Surgeon: Everette Rank, MD;  Location: [facility] ORS;  Service: General;  Laterality: N/A;  . NECK SURGERY    . TONSILLECTOMY      Social History   Socioeconomic History  . Marital status: Married    Spouse name: Not on file  . Number of children: Not on file  . Years of education: Not on file  . Highest education level: Not on file  Occupational History  . Not on file  Social Needs  . Financial  resource strain: Not on file  . Food insecurity:    Worry: Not on file    Inability: Not on file  . Transportation needs:    Medical: Not on file    Non-medical: Not on file  Tobacco Use  . Smoking status: Never Smoker  . Smokeless tobacco: Never Used  Substance and Sexual Activity  . Alcohol use: No    Alcohol/week: 0.0 standard drinks  . Drug use: No  . Sexual activity: Yes    Birth control/protection: None  Lifestyle  . Physical activity:    Days per week: Not on file    Minutes per session: Not on file  . Stress: Not on file  Relationships  . Social connections:    Talks on phone: Not on file    Gets together: Not on file    Attends religious service: Not on file    Active member of club or  organization: Not on file    Attends meetings of clubs or organizations: Not on file    Relationship status: Not on file  . Intimate partner violence:    Fear of current or ex partner: Not on file    Emotionally abused: Not on file    Physically abused: Not on file    Forced sexual activity: Not on file  Other Topics Concern  . Not on file  Social History Narrative  . Not on file    Current Outpatient Medications on File Prior to Visit  Medication Sig Dispense Refill  . Biotin 5000 MCG TABS Take 5,000 mcg by mouth daily.    . Calcium Citrate-Vitamin D (CALCIUM CITRATE CHEWY BITE) 500-500 MG-UNIT CHEW Chew 500 mg by mouth 3 (three) times daily.    . cetirizine (ZYRTEC) 10 MG tablet Take 10 mg by mouth daily.    . DULoxetine (CYMBALTA) 60 MG capsule Take 1 capsule (60 mg total) by mouth 2 (two) times daily. 60 capsule 2  . EPINEPHrine (EPIPEN IJ) Inject 1 application as directed once as needed (for allergies).    . fluticasone (FLONASE) 50 MCG/ACT nasal spray Place 1 spray into the nose as needed.     . Pediatric Multivit-Minerals-C (COMPLETE MULTI-VITAMIN) CHEW Chew by mouth.    . traZODone (DESYREL) 100 MG tablet Take 2 tablets (200 mg total) by mouth at bedtime as needed for sleep. 60 tablet 2   No current facility-administered medications on file prior to visit.     Allergies  Allergen Reactions  . Tea Anaphylaxis  . Eggs Or Egg-Derived Products Swelling      Review of Systems ROS Review of Systems - General ROS: negative for - chills, fatigue, fever, hot flashes, night sweats, weight gain or weight loss Psychological ROS: negative for - anxiety, decreased libido, depression, mood swings, physical abuse or sexual abuse Ophthalmic ROS: negative for - blurry vision, eye pain or loss of vision ENT ROS: negative for - headaches, hearing change, visual changes or vocal changes Allergy and Immunology ROS: negative for - hives, itchy/watery eyes or seasonal  allergies Hematological and Lymphatic ROS: negative for - bleeding problems, bruising, swollen lymph nodes or weight loss Endocrine ROS: negative for - galactorrhea, hair pattern changes, hot flashes, malaise/lethargy, mood swings, palpitations, polydipsia/polyuria, skin changes, temperature intolerance or unexpected weight changes Breast ROS: negative for - new or changing breast lumps or nipple discharge Respiratory ROS: negative for - cough or shortness of breath Cardiovascular ROS: negative for - chest pain, irregular heartbeat, palpitations or shortness of breath Gastrointestinal  ROS: no abdominal pain, change in bowel habits, or black or bloody stools Genito-Urinary ROS: (see HPI). No dysuria, trouble voiding, or hematuria. Also noting vaginal dryness.   Musculoskeletal ROS: negative for - joint pain or joint stiffness Neurological ROS: negative for - bowel and bladder control changes Dermatological ROS: negative for rash and skin lesion changes   Objective:   BP (!) 142/85   Pulse 77   Ht 5\' 6"  (1.676 m)   Wt 185 lb 3.2 oz (84 kg)   LMP 03/09/2016   BMI 29.89 kg/m  CONSTITUTIONAL: Well-developed, well-nourished female in no acute distress. Overweight PSYCHIATRIC: Normal mood and affect. Normal behavior. Normal judgment and thought content. NEUROLGIC: Alert and oriented to person, place, and time. Normal muscle tone coordination. No cranial nerve deficit noted. HENT:  Normocephalic, atraumatic, External right and left ear normal. Oropharynx is clear and moist EYES: Conjunctivae and EOM are normal. Pupils are equal, round, and reactive to light. No scleral icterus.  NECK: Normal range of motion, supple, no masses.  Normal thyroid.  SKIN: Skin is warm and dry. No rash noted. Not diaphoretic. No erythema. No pallor. CARDIOVASCULAR: Normal heart rate noted, regular rhythm, no murmur. RESPIRATORY: Clear to auscultation bilaterally. Effort and breath sounds normal, no problems with  respiration noted. BREASTS: Symmetric in size. No masses, skin changes, nipple drainage, or lymphadenopathy. ABDOMEN: Soft, normal bowel sounds, no distention noted.  No tenderness, rebound or guarding.  BLADDER: Normal PELVIC:  Bladder no bladder distension noted  Urethra: normal appearing urethra with no masses, tenderness or lesions  Vulva: normal appearing vulva with no tenderness. Small pebble-like cystic lesion in right labia minora, likely incusion cyst.   Vagina: normal appearing vagina without discharge, no lesions.  Mild vaginal atrophy present   Cervix: normal appearing cervix without discharge or lesions  Uterus: uterus is normal size, shape, consistency and nontender  Adnexa: normal adnexa in size, nontender and no masses  RV: External Exam NormaI, No Rectal Masses and Normal Sphincter tone  MUSCULOSKELETAL: Normal range of motion. No tenderness.  No cyanosis, clubbing, or edema.  2+ distal pulses. LYMPHATIC: No Axillary, Supraclavicular, or Inguinal Adenopathy.   Labs: Recent labs reviewed from [facility], performed 01/07/2019 (patient brought records today). Labs normal.     Assessment:   Annual gynecologic examination 53 y.o. menopausal female Overweight  Inclusion cyst cyst UTI symptoms Vaginal dryness  Plan:  Pap: Pap Co Test Mammogram: Patient scheduled for mammogram at the end of the week Stool Guaiac Testing:  Not Ordered.  Will order colonoscopy.  Labs: labs reviewed from PCP.  Routine preventative health maintenance measures emphasized: Exercise/Diet/Weight control, Alcohol/Substance use risks and Stress Management Declines flu vaccine (has allergy to eggs) UTI symptoms - patient requests different antibiotic. Will order culture, given samples of Uribel. Also given new prescription for Cipro. UA today with no evidence of UTI but did recently complete Macrobid course.  Inclusion cyst small, not bothersome. Noted that these usually don't require  any intervention and can resolve with time.  Discussed management options for vaginal dryness, including natural remedies (coconut, vitamin E oil), vaginal moisturizers, or vaginal hormonal inserts (cream, tablet).  Patient desires to try natural remedies with coconut oil.  Return to Clinic - 1 Year   Hildred LaserAnika Velena Keegan, MD  [facility]

## 2019-01-09 ENCOUNTER — Encounter: Payer: Self-pay | Admitting: Obstetrics and Gynecology

## 2019-01-10 LAB — POCT URINALYSIS DIPSTICK
BILIRUBIN UA: NEGATIVE
GLUCOSE UA: NEGATIVE
KETONES UA: NEGATIVE
LEUKOCYTES UA: NEGATIVE
Nitrite, UA: NEGATIVE
Protein, UA: NEGATIVE
RBC UA: NEGATIVE
SPEC GRAV UA: 1.01 (ref 1.010–1.025)
Urobilinogen, UA: 0.2 E.U./dL
pH, UA: 7 (ref 5.0–8.0)

## 2019-08-26 ENCOUNTER — Other Ambulatory Visit: Payer: Self-pay

## 2019-08-26 ENCOUNTER — Ambulatory Visit (INDEPENDENT_AMBULATORY_CARE_PROVIDER_SITE_OTHER): Payer: Medicare HMO | Admitting: Surgery

## 2019-08-26 ENCOUNTER — Encounter: Payer: Self-pay | Admitting: Surgery

## 2019-08-26 VITALS — BP 141/84 | HR 94 | Temp 98.1°F | Resp 16 | Ht 66.0 in | Wt 190.4 lb

## 2019-08-26 DIAGNOSIS — R1030 Lower abdominal pain, unspecified: Secondary | ICD-10-CM

## 2019-08-26 NOTE — Progress Notes (Signed)
Patient ID: Melinda Harrison, female   DOB: [birth year], 53 y.o.   MRN: [id number]  HPI LILIANAH BUFFIN is a 53 y.o. female seen in consultation at the request of Mrs. Stann Mainland NP for abdominal pain.  She reports that the pain started about 2 weeks ago it is intermittent mainly located in the right upper quadrant.  The pain is sharp worsening after meals.  She reports some chills.  No fevers.  Decreased appetite nausea and vomiting.  No evidence of biliary obstruction.  She did have questionable melena.  She reports the color of the emesis was somewhat pink.  She did have a history of laparoscopic gastric bypass 4 years ago in [facility] by Dr. Duke Salvia.  She did not have any surgical complications.  She is able to perform more than 4 METS of activity without any shortness of breath or chest pain.  [facility] where [facility] was performed and I have personally reviewed the images.  There is however only 2 pictures I was able to see there is evidence of shadowing gallstones.  I cannot really tell the size of the common bile duct and I cannot really tell whether or not there is pericholecystic fluid or gallbladder wall thickening. She was recently diagnosed with a UTI and is taking a quinolone for it  HPI  Past Medical History:  Diagnosis Date  . Anemia   . Anxiety   . Asthma   . Back pain   . COPD (chronic obstructive pulmonary disease) ([place])   . Depression   . Headache   . History of DVT (deep vein thrombosis)    Occurred in 2017  . Hypertension   . IBS (irritable bowel syndrome)   . PTSD (post-traumatic stress disorder)   . PTSD (post-traumatic stress disorder)     Past Surgical History:  Procedure Laterality Date  . DILATION AND CURETTAGE OF UTERUS    . GASTRIC ROUX-EN-Y N/A 11/23/2015   Procedure: LAPAROSCOPIC ROUX-EN-Y GASTRIC BYPASS, HIATAL HERNIA REPAIR;  Surgeon: Ladora Daniel, MD;  Location: [facility] ORS;  Service: General;  Laterality: N/A;   . NECK SURGERY    . TONSILLECTOMY      Family History  Problem Relation Age of Onset  . Hypertension Mother   . Thyroid disease Mother   . Heart disease Mother   . Heart failure Mother   . Deep vein thrombosis Father   . Hypertension Father   . Depression Brother   . Hypertension Brother   . Depression Brother   . Hypertension Brother     Social History Social History   Tobacco Use  . Smoking status: Never Smoker  . Smokeless tobacco: Never Used  Substance Use Topics  . Alcohol use: No    Alcohol/week: 0.0 standard drinks  . Drug use: No    Allergies  Allergen Reactions  . Tea Anaphylaxis  . Eggs Or Egg-Derived Products Swelling and Other (See Comments)    Current Outpatient Medications  Medication Sig Dispense Refill  . Biotin 5000 MCG TABS Take 5,000 mcg by mouth daily.    . Calcium Citrate-Vitamin D (CALCIUM CITRATE CHEWY BITE) 500-500 MG-UNIT CHEW Chew 500 mg by mouth 3 (three) times daily.    . cetirizine (ZYRTEC) 10 MG tablet Take 10 mg by mouth daily.    . DULoxetine (CYMBALTA) 60 MG capsule Take 1 capsule (60 mg total) by mouth 2 (two) times daily. 60 capsule 2  . EPINEPHrine (EPIPEN IJ)  Inject 1 application as directed once as needed (for allergies).    Marland Kitchen. levofloxacin (LEVAQUIN) 750 MG tablet Take 750 mg by mouth daily.    . Pediatric Multivit-Minerals-C (COMPLETE MULTI-VITAMIN) CHEW Chew by mouth.    . tamsulosin (FLOMAX) 0.4 MG CAPS capsule Take 0.4 mg by mouth.    . traZODone (DESYREL) 100 MG tablet Take 2 tablets (200 mg total) by mouth at bedtime as needed for sleep. 60 tablet 2   No current facility-administered medications for this visit.      Review of Systems Full ROS  was asked and was negative except for the information on the HPI  Physical Exam Blood pressure (!) 141/84, pulse 94, temperature 98.1 F (36.7 C), temperature source Temporal, resp. rate 16, height 5\' 6"  (1.676 m), weight 190 lb 6.4 oz (86.4 kg), last menstrual period  03/09/2016, SpO2 98 %. CONSTITUTIONAL: NAD EYES: Pupils are equal, round, and reactive to light, Sclera are non-icteric. EARS, NOSE, MOUTH AND THROAT: The oropharynx is clear. The oral mucosa is pink and moist. Hearing is intact to voice. LYMPH NODES:  Lymph nodes in the neck are normal. RESPIRATORY:  Lungs are clear. There is normal respiratory effort, with equal breath sounds bilaterally, and without pathologic use of accessory muscles. CARDIOVASCULAR: Heart is regular without murmurs, gallops, or rubs. GI: The abdomen is soft, there is diffuse tenderness to palpation.  There is definite tenderness palpation right upper quadrant but also tenderness to palpation in the epigastric area and left upper quadrant.  No overt peritonitis.  Well-healed scars from gastric bypass.  No evidence of any ventral hernias.  GU: Rectal deferred.   MUSCULOSKELETAL: Normal muscle strength and tone. No cyanosis or edema.   SKIN: Turgor is good and there are no pathologic skin lesions or ulcers. NEUROLOGIC: Motor and sensation is grossly normal. Cranial nerves are grossly intact. PSYCH:  Oriented to person, place and time. Affect is normal.  Data Reviewed  I have personally reviewed the patient's imaging, laboratory findings and medical records.    Assessment/Plan 53 year old female with abdominal pain.  Possible etiologies will include gallbladder, potential internal hernia or marginal ulcer from gastric bypass.  At this point she does not appear toxic nor peritoneal.  Will obtain a CT scan of the abdomen and pelvis as soon as possible tomorrow morning and will also add LFTs as well as a CBC.  We will update her with results tomorrow.  If there is evidence of biliary disease and no other intra-abdominal pathology she will likely benefit from cholecystectomy.  She may also benefit from upper and lower endoscopy to assess any potential bleed from the gastric bypass.  There is no need for emergent surgical intervention  at this time.  A copy of this report was sent to the referring provider  Time spent with the patient was minutes, with more than 50% of the time spent in face-to-face education, counseling and care coordination.     Sterling Bigiego Travus Oren, MD FACS General Surgeon 08/26/2019, 2:18 PM

## 2019-08-26 NOTE — Patient Instructions (Signed)
Please add the labs on per Dr.Pabon as was discussed in the office.   CT scan scheduled 08/27/2019 @ 10:45 at [facility] center.  Nothing by mouth 4 hours prior to having the scan.   Please go to Outpatient Imaging today to pick up the prep kit.    Please call our office around 2 pm and we will discuss your results.

## 2019-08-27 ENCOUNTER — Telehealth: Payer: Self-pay

## 2019-08-27 ENCOUNTER — Ambulatory Visit
Admission: RE | Admit: 2019-08-27 | Discharge: 2019-08-27 | Disposition: A | Discharge status: Home / Self Care | Payer: Medicare HMO | Source: Ambulatory Visit | Attending: Surgery | Admitting: Surgery

## 2019-08-27 ENCOUNTER — Other Ambulatory Visit
Admission: RE | Admit: 2019-08-27 | Discharge: 2019-08-27 | Disposition: A | Discharge status: Home / Self Care | Payer: Medicare HMO | Source: Ambulatory Visit | Attending: Surgery | Admitting: Surgery

## 2019-08-27 ENCOUNTER — Other Ambulatory Visit: Payer: Self-pay

## 2019-08-27 ENCOUNTER — Other Ambulatory Visit: Payer: Self-pay | Admitting: *Deleted

## 2019-08-27 DIAGNOSIS — I10 Essential (primary) hypertension: Secondary | ICD-10-CM | POA: Diagnosis present

## 2019-08-27 DIAGNOSIS — R1030 Lower abdominal pain, unspecified: Secondary | ICD-10-CM

## 2019-08-27 LAB — CREATININE, SERUM
Creatinine, Ser: 0.79 mg/dL (ref 0.44–1.00)
GFR calc Af Amer: >60 mL/min (ref >60)
GFR calc non Af Amer: >60 mL/min (ref >60)

## 2019-08-27 MED ORDER — IOHEXOL 300 MG/ML  SOLN
100.0000 mL | Freq: Once | INTRAMUSCULAR | Status: AC | PRN
  Administered 2019-08-27: 100 mL via INTRAVENOUS

## 2019-08-27 NOTE — Telephone Encounter (Signed)
Patient notified normal CT results and reminded follow up appointment 09/09/2019.

## 2019-08-27 NOTE — Progress Notes (Signed)
Per Glenard Haring with CT at [facility], patient will need a creatinine drawn prior to CT scan and needs order entered.   This has been done accordingly.

## 2019-09-03 ENCOUNTER — Ambulatory Visit: Payer: Medicare HMO | Admitting: Surgery

## 2019-09-03 ENCOUNTER — Ambulatory Visit: Payer: Self-pay | Admitting: Surgery

## 2019-09-09 ENCOUNTER — Ambulatory Visit: Payer: Self-pay | Admitting: General Surgery

## 2019-09-10 ENCOUNTER — Encounter: Payer: Self-pay | Admitting: Surgery

## 2019-09-10 ENCOUNTER — Ambulatory Visit: Payer: Medicare HMO | Admitting: Surgery

## 2019-09-10 ENCOUNTER — Other Ambulatory Visit: Payer: Self-pay

## 2019-09-10 VITALS — BP 94/65 | HR 74 | Temp 98.1°F | Ht 66.0 in | Wt 188.0 lb

## 2019-09-10 DIAGNOSIS — K8020 Calculus of gallbladder without cholecystitis without obstruction: Secondary | ICD-10-CM | POA: Diagnosis not present

## 2019-09-10 NOTE — Progress Notes (Signed)
Outpatient Surgical Follow Up  09/10/2019  Melinda Harrison is an 53 y.o. female.   No chief complaint on file.   HPI: Melinda Harrison is a 53 year old following up for abdominal pain.  She continues to have intermittent abdominal pain that radiates to the right upper quadrant.  It is colicky and sometimes is diffuse and located in the left side.  I personally reviewed the CT scan of the abdomen and pelvis showing no evidence of any internal hernias or complications from her gastric bypass.  Also review the ultrasound report showing evidence of cholelithiasis normal common bile duct.  She is able to perform more than 4 METS of activity without any shortness of breath or chest pain.  She denies any fevers any chills no evidence of biliary obstruction.  Past Medical History:  Diagnosis Date  . Anemia   . Anxiety   . Asthma   . Back pain   . COPD (chronic obstructive pulmonary disease) ([place])   . Depression   . Headache   . History of DVT (deep vein thrombosis)    Occurred in 2017  . Hypertension   . IBS (irritable bowel syndrome)   . PTSD (post-traumatic stress disorder)   . PTSD (post-traumatic stress disorder)     Past Surgical History:  Procedure Laterality Date  . DILATION AND CURETTAGE OF UTERUS    . GASTRIC ROUX-EN-Y N/A 11/23/2015   Procedure: LAPAROSCOPIC ROUX-EN-Y GASTRIC BYPASS, HIATAL HERNIA REPAIR;  Surgeon: Ladora Daniel, MD;  Location: [facility] ORS;  Service: General;  Laterality: N/A;  . NECK SURGERY    . TONSILLECTOMY      Family History  Problem Relation Age of Onset  . Hypertension Mother   . Thyroid disease Mother   . Heart disease Mother   . Heart failure Mother   . Deep vein thrombosis Father   . Hypertension Father   . Depression Brother   . Hypertension Brother   . Depression Brother   . Hypertension Brother     Social History:  reports that she has never smoked. She has never used smokeless tobacco. She reports that she does not drink alcohol or use  drugs.  Allergies:  Allergies  Allergen Reactions  . Tea Anaphylaxis  . Eggs Or Egg-Derived Products Swelling and Other (See Comments)    Medications reviewed.    ROS Full ROS performed and is otherwise negative other than what is stated in HPI   BP 94/65   Pulse 74   Temp 98.1 F (36.7 C)   Ht 5\' 6"  (1.676 m)   Wt 188 lb (85.3 kg)   LMP 03/09/2016   SpO2 99%   BMI 30.34 kg/m   Physical Exam Vitals signs and nursing note reviewed. Exam conducted with a chaperone present.  Constitutional:      General: She is not in acute distress.    Appearance: Normal appearance. She is normal weight.  Eyes:     General: No scleral icterus.       Right eye: No discharge.        Left eye: No discharge.  Neck:     Musculoskeletal: Normal range of motion and neck supple. No neck rigidity.  Cardiovascular:     Rate and Rhythm: Normal rate and regular rhythm.     Heart sounds: No murmur.  Pulmonary:     Effort: Pulmonary effort is normal. No respiratory distress.     Breath sounds: Normal breath sounds. No stridor.  Abdominal:  General: There is no distension.     Palpations: Abdomen is soft. There is no mass.     Tenderness: There is abdominal tenderness. There is no guarding.     Hernia: No hernia is present.     Comments: Mild TTP RUQ and LLQ, no peritonitis  Skin:    General: Skin is warm and dry.     Capillary Refill: Capillary refill takes less than 2 seconds.  Neurological:     General: No focal deficit present.     Mental Status: She is alert and oriented to person, place, and time.  Psychiatric:        Mood and Affect: Mood normal.        Behavior: Behavior normal.        Thought Content: Thought content normal.        Judgment: Judgment normal.      Assessment/Plan:  1. Calculus of gallbladder without cholecystitis without obstruction.  I had an extensive discussion with the patient regarding her disease process.  Although I do think that some of her  symptoms are attributed to gallstones some of them may not and may be attributed to irritable bowel syndrome.  I specifically stated that the gallbladder symptoms will improve but some of the other issues may not.  After inspection of discussion she is in agreement with cholecystectomy.  I do think that she is a good candidate for a robotic approach I discussed the procedure in detail.  The patient was given educational material.  We discussed the risks and benefits of a laparoscopic cholecystectomy and possible cholangiogram including, but not limited to bleeding, infection, injury to surrounding structures such as the intestine or liver, bile leak, retained gallstones, need to convert to an open procedure, prolonged diarrhea, blood clots such as  DVT, common bile duct injury, anesthesia risks, and possible need for additional procedures.  The likelihood of improvement in symptoms and return to the patient's normal status is good. We discussed the typical post-operative recovery course.  Greater than 50% of the 40 minutes  visit was spent in counseling/coordination of care   Kiela Shisler, MD FACS General Surgeon 

## 2019-09-10 NOTE — Patient Instructions (Signed)
You may try adding fiber to your diet. May use Citrucel or Metamucil.    You have requested to have your gallbladder removed. This will be done at [facility] with Dr. Everlene FarrierPabon.  You will most likely be out of work 1-2 weeks for this surgery. You will return after your post-op appointment with a lifting restriction for approximately 4 more weeks.  You will be able to eat anything you would like to following surgery. But, start by eating a bland diet and advance this as tolerated. The Gallbladder diet is below, please go as closely by this diet as possible prior to surgery to avoid any further attacks.  Please see the (blue)pre-care form that you have been given today. If you have any questions, please call our office.  Laparoscopic Cholecystectomy Laparoscopic cholecystectomy is surgery to remove the gallbladder. The gallbladder is located in the upper right part of the abdomen, behind the liver. It is a storage sac for bile, which is produced in the liver. Bile aids in the digestion and absorption of fats. Cholecystectomy is often done for inflammation of the gallbladder (cholecystitis). This condition is usually caused by a buildup of gallstones (cholelithiasis) in the gallbladder. Gallstones can block the flow of bile, and that can result in inflammation and pain. In severe cases, emergency surgery may be required. If emergency surgery is not required, you will have time to prepare for the procedure. Laparoscopic surgery is an alternative to open surgery. Laparoscopic surgery has a shorter recovery time. Your common bile duct may also need to be examined during the procedure. If stones are found in the common bile duct, they may be removed. LET [facility] Center[facility]OUR [facility] CARE PROVIDER KNOW ABOUT:  Any allergies you have.  All medicines you are taking, including vitamins, herbs, eye drops, creams, and over-the-counter medicines.  Previous problems you or members of your family have had with the use of  anesthetics.  Any blood disorders you have.  Previous surgeries you have had.    Any medical conditions you have. RISKS AND COMPLICATIONS Generally, this is a safe procedure. However, problems may occur, including:  Infection.  Bleeding.  Allergic reactions to medicines.  Damage to other structures or organs.  A stone remaining in the common bile duct.  A bile leak from the cyst duct that is clipped when your gallbladder is removed.  The need to convert to open surgery, which requires a larger incision in the abdomen. This may be necessary if your surgeon thinks that it is not safe to continue with a laparoscopic procedure. BEFORE THE PROCEDURE  Ask your health care provider about:  Changing or stopping your regular medicines. This is especially important if you are taking diabetes medicines or blood thinners.  Taking medicines such as aspirin and ibuprofen. These medicines can thin your blood. Do not take these medicines before your procedure if your health care provider instructs you not to.  Follow instructions from your health care provider about eating or drinking restrictions.  Let your health care provider know if you develop a cold or an infection before surgery.  Plan to have someone take you home after the procedure.  Ask your health care provider how your surgical site will be marked or identified.  You may be given antibiotic medicine to help prevent infection. PROCEDURE  To reduce your risk of infection:  Your health care team will wash or sanitize their hands.  Your skin will be washed with soap.  An IV tube may be inserted  into one of your veins.  You will be given a medicine to make you fall asleep (general anesthetic).  A breathing tube will be placed in your mouth.  The surgeon will make several small cuts (incisions) in your abdomen.  A thin, lighted tube (laparoscope) that has a tiny camera on the end will be inserted through one of the  small incisions. The camera on the laparoscope will send a picture to a TV screen (monitor) in the operating room. This will give the surgeon a good view inside your abdomen.  A gas will be pumped into your abdomen. This will expand your abdomen to give the surgeon more room to perform the surgery.  Other tools that are needed for the procedure will be inserted through the other incisions. The gallbladder will be removed through one of the incisions.  After your gallbladder has been removed, the incisions will be closed with stitches (sutures), staples, or skin glue.  Your incisions may be covered with a bandage (dressing). The procedure may vary among health care providers and hospitals. AFTER THE PROCEDURE  Your blood pressure, heart rate, breathing rate, and blood oxygen level will be monitored often until the medicines you were given have worn off.  You will be given medicines as needed to control your pain.   This information is not intended to replace advice given to you by your health care provider. Make sure you discuss any questions you have with your health care provider.   Document Released: 12/11/2005 Document Revised: 09/01/2015 Document Reviewed: 07/23/2013 Elsevier Interactive Patient Education 2016 [place] Diet for Gallbladder Conditions A low-fat diet can be helpful if you have pancreatitis or a gallbladder condition. With these conditions, your pancreas and gallbladder have trouble digesting fats. A healthy eating plan with less fat will help rest your pancreas and gallbladder and reduce your symptoms. WHAT DO I NEED TO KNOW ABOUT THIS DIET?  Eat a low-fat diet.  Reduce your fat intake to less than 20-30% of your total daily calories. This is less than 50-60 g of fat per day.  Remember that you need some fat in your diet. Ask your dietician what your daily goal should be.  Choose nonfat and low-fat healthy foods. Look for the words "nonfat," "low  fat," or "fat free."  As a guide, look on the label and choose foods with less than 3 g of fat per serving. Eat only one serving.  Avoid alcohol.  Do not smoke. If you need help quitting, talk with your health care provider.  Eat small frequent meals instead of three large heavy meals. WHAT FOODS CAN I EAT? Grains Include healthy grains and starches such as potatoes, wheat bread, fiber-rich cereal, and brown rice. Choose whole grain options whenever possible. In adults, whole grains should account for 45-65% of your daily calories.  Fruits and Vegetables Eat plenty of fruits and vegetables. Fresh fruits and vegetables add fiber to your diet. Meats and Other Protein Sources Eat lean meat such as chicken and pork. Trim any fat off of meat before cooking it. Eggs, fish, and beans are other sources of protein. In adults, these foods should account for 10-35% of your daily calories. Dairy Choose low-fat milk and dairy options. Dairy includes fat and protein, as well as calcium.  Fats and Oils Limit high-fat foods such as fried foods, sweets, baked goods, sugary drinks.  Other Creamy sauces and condiments, such as mayonnaise, can add extra fat. Think about whether or  not you need to use them, or use smaller amounts or low fat options. WHAT FOODS ARE NOT RECOMMENDED?  High fat foods, such as:  [employer].  Ice cream.  [place] toast.  Sweet rolls.  Pizza.  Cheese bread.  Foods covered with batter, butter, creamy sauces, or cheese.  Fried foods.  Sugary drinks and desserts.  Foods that cause gas or bloating   This information is not intended to replace advice given to you by your health care provider. Make sure you discuss any questions you have with your health care provider.   Document Released: 12/16/2013 Document Reviewed: 12/16/2013 Elsevier Interactive Patient Education [employer]

## 2019-09-10 NOTE — H&P (View-Only) (Signed)
Outpatient Surgical Follow Up  09/10/2019  Melinda Harrison is an 53 y.o. female.   No chief complaint on file.   HPI: Melinda Harrison is a 53 year old following up for abdominal pain.  She continues to have intermittent abdominal pain that radiates to the right upper quadrant.  It is colicky and sometimes is diffuse and located in the left side.  I personally reviewed the CT scan of the abdomen and pelvis showing no evidence of any internal hernias or complications from her gastric bypass.  Also review the ultrasound report showing evidence of cholelithiasis normal common bile duct.  She is able to perform more than 4 METS of activity without any shortness of breath or chest pain.  She denies any fevers any chills no evidence of biliary obstruction.  Past Medical History:  Diagnosis Date  . Anemia   . Anxiety   . Asthma   . Back pain   . COPD (chronic obstructive pulmonary disease) ([place])   . Depression   . Headache   . History of DVT (deep vein thrombosis)    Occurred in 2017  . Hypertension   . IBS (irritable bowel syndrome)   . PTSD (post-traumatic stress disorder)   . PTSD (post-traumatic stress disorder)     Past Surgical History:  Procedure Laterality Date  . DILATION AND CURETTAGE OF UTERUS    . GASTRIC ROUX-EN-Y N/A 11/23/2015   Procedure: LAPAROSCOPIC ROUX-EN-Y GASTRIC BYPASS, HIATAL HERNIA REPAIR;  Surgeon: Ladora Daniel, MD;  Location: [facility] ORS;  Service: General;  Laterality: N/A;  . NECK SURGERY    . TONSILLECTOMY      Family History  Problem Relation Age of Onset  . Hypertension Mother   . Thyroid disease Mother   . Heart disease Mother   . Heart failure Mother   . Deep vein thrombosis Father   . Hypertension Father   . Depression Brother   . Hypertension Brother   . Depression Brother   . Hypertension Brother     Social History:  reports that she has never smoked. She has never used smokeless tobacco. She reports that she does not drink alcohol or use  drugs.  Allergies:  Allergies  Allergen Reactions  . Tea Anaphylaxis  . Eggs Or Egg-Derived Products Swelling and Other (See Comments)    Medications reviewed.    ROS Full ROS performed and is otherwise negative other than what is stated in HPI   BP 94/65   Pulse 74   Temp 98.1 F (36.7 C)   Ht 5\' 6"  (1.676 m)   Wt 188 lb (85.3 kg)   LMP 03/09/2016   SpO2 99%   BMI 30.34 kg/m   Physical Exam Vitals signs and nursing note reviewed. Exam conducted with a chaperone present.  Constitutional:      General: She is not in acute distress.    Appearance: Normal appearance. She is normal weight.  Eyes:     General: No scleral icterus.       Right eye: No discharge.        Left eye: No discharge.  Neck:     Musculoskeletal: Normal range of motion and neck supple. No neck rigidity.  Cardiovascular:     Rate and Rhythm: Normal rate and regular rhythm.     Heart sounds: No murmur.  Pulmonary:     Effort: Pulmonary effort is normal. No respiratory distress.     Breath sounds: Normal breath sounds. No stridor.  Abdominal:  General: There is no distension.     Palpations: Abdomen is soft. There is no mass.     Tenderness: There is abdominal tenderness. There is no guarding.     Hernia: No hernia is present.     Comments: Mild TTP RUQ and LLQ, no peritonitis  Skin:    General: Skin is warm and dry.     Capillary Refill: Capillary refill takes less than 2 seconds.  Neurological:     General: No focal deficit present.     Mental Status: She is alert and oriented to person, place, and time.  Psychiatric:        Mood and Affect: Mood normal.        Behavior: Behavior normal.        Thought Content: Thought content normal.        Judgment: Judgment normal.      Assessment/Plan:  1. Calculus of gallbladder without cholecystitis without obstruction.  I had an extensive discussion with the patient regarding her disease process.  Although I do think that some of her  symptoms are attributed to gallstones some of them may not and may be attributed to irritable bowel syndrome.  I specifically stated that the gallbladder symptoms will improve but some of the other issues may not.  After inspection of discussion she is in agreement with cholecystectomy.  I do think that she is a good candidate for a robotic approach I discussed the procedure in detail.  The patient was given Agricultural engineered[employer]ational material.  We discussed the risks and benefits of a laparoscopic cholecystectomy and possible cholangiogram including, but not limited to bleeding, infection, injury to surrounding structures such as the intestine or liver, bile leak, retained gallstones, need to convert to an open procedure, prolonged diarrhea, blood clots such as  DVT, common bile duct injury, anesthesia risks, and possible need for additional procedures.  The likelihood of improvement in symptoms and return to the patient's normal status is good. We discussed the typical post-operative recovery course.  Greater than 50% of the 40 minutes  visit was spent in counseling/coordination of care   Sterling Bigiego Hiroshi Krummel, MD [facility] General Surgeon

## 2019-09-11 ENCOUNTER — Telehealth: Payer: Self-pay | Admitting: Surgery

## 2019-09-11 NOTE — Telephone Encounter (Signed)
Pt has been advised of pre admission date/time, Covid Testing date and Surgery date.  Surgery Date: 09/25/19 with Dr Kris Mouton assisted laparoscopic cholecystectomy.  Preadmission Testing Date: 09/15/19 between 1-5:00pm-phone interview.  Covid Testing Date: 09/22/19 between 8-10:30am - patient advised to go to the [facility]  [employer] Video sent via [employer] and [employer].  Patient has been made aware to call [phone], between 1-3:00pm the day before surgery, to find out what time to arrive.

## 2019-09-15 ENCOUNTER — Encounter
Admission: RE | Admit: 2019-09-15 | Discharge: 2019-09-15 | Disposition: A | Discharge status: Home / Self Care | Payer: Medicare HMO | Source: Ambulatory Visit | Attending: Surgery | Admitting: Surgery

## 2019-09-15 ENCOUNTER — Other Ambulatory Visit: Payer: Self-pay

## 2019-09-15 DIAGNOSIS — Z01812 Encounter for preprocedural laboratory examination: Secondary | ICD-10-CM | POA: Insufficient documentation

## 2019-09-15 HISTORY — DX: Personal history of urinary calculi: Z87.442

## 2019-09-15 HISTORY — DX: Gastro-esophageal reflux disease without esophagitis: K21.9

## 2019-09-15 HISTORY — DX: Personal history of other diseases of the digestive system: Z87.19

## 2019-09-15 HISTORY — DX: Unspecified osteoarthritis, unspecified site: M19.90

## 2019-09-15 NOTE — Patient Instructions (Addendum)
Your procedure is scheduled on: 09-25-19 THURSDAY Report to Same Day Surgery 2nd floor medical mall [facility] Entrance-take elevator on left to 2nd floor.  Check in with surgery information desk.) To find out your arrival time please call [phone] between 1PM - 3PM on 09-24-19 FRIDAY  Remember: Instructions that are not followed completely may result in serious medical risk, up to and including death, or upon the discretion of your surgeon and anesthesiologist your surgery may need to be rescheduled.    _x___ 1. Do not eat food after midnight the night before your procedure. NO GUM OR CANDY AFTER MIDNIGHT. You may drink clear liquids up to 2 hours before you are scheduled to arrive at the hospital for your procedure.  Do not drink clear liquids within 2 hours of your scheduled arrival to the hospital.  Clear liquids include  --Water or Apple juice without pulp  --Gatorade  --Black Coffee or Clear Tea (No milk, no creamers, do not add anything to the coffee or Tea Type 1 and type 2 diabetics should only drink water.   ____Ensure clear carbohydrate drink on the way to the hospital for bariatric patients  ____Ensure clear carbohydrate drink 3 hours before surgery.    __x__ 2. No Alcohol for 24 hours before or after surgery.   __x__3. No Smoking or e-cigarettes for 24 prior to surgery.  Do not use any chewable tobacco products for at least 6 hour prior to surgery   ____  4. Bring all medications with you on the day of surgery if instructed.    __x__ 5. Notify your doctor if there is any change in your medical condition     (cold, fever, infections).    x___6. On the morning of surgery brush your teeth with toothpaste and water.  You may rinse your mouth with mouth wash if you wish.  Do not swallow any toothpaste or mouthwash.   Do not wear jewelry, make-up, hairpins, clips or nail polish.  Do not wear lotions, powders, or perfumes.  Do not shave 48 hours prior to surgery. Men  may shave face and neck.  Do not bring valuables to the hospital.    [facility] is not responsible for any belongings or valuables.               Contacts, dentures or bridgework may not be worn into surgery.  Leave your suitcase in the car. After surgery it may be brought to your room.  For patients admitted to the hospital, discharge time is determined by your  treatment team.  _  Patients discharged the day of surgery will not be allowed to drive home.  You will need someone to drive you home and stay with you the night of your procedure.    Please read over the following fact sheets that you were given:   [facility] Preparing for Surgery   _x___ TAKE THE FOLLOWING MEDICATION THE MORNING OF SURGERY WITH A SMALL SIP OF WATER. These include:  1. ZYRTEC (CETIRIZINE)  2. CYMBALTA (DULOXETINE)  3. YOU MAY TAKE XANAX THE MORNING OF SURGERY IF NEEDED  4.  5.  6.  ____Fleets enema or Magnesium Citrate as directed.   _x___ Use CHG Soap or sage wipes as directed on instruction sheet   ____ Use inhalers on the day of surgery and bring to hospital day of surgery  ____ Stop Metformin and Janumet 2 days prior to surgery.    ____ Take 1/2 of usual insulin  dose the night before surgery and none on the morning surgery.   ____ Follow recommendations from Cardiologist, Pulmonologist or PCP regarding stopping Aspirin, Coumadin, Plavix ,Eliquis, Effient, or Pradaxa, and Pletal.  X____Stop Anti-inflammatories such as Advil, Aleve, Ibuprofen, Motrin, Naproxen, Naprosyn, Goodies powders or aspirin products 7 DAYS PRIOR TO SURGERY-OK to take Tylenol    ____ Stop supplements until after surgery.     ____ Bring C-Pap to the hospital.

## 2019-09-22 ENCOUNTER — Other Ambulatory Visit
Admission: RE | Admit: 2019-09-22 | Discharge: 2019-09-22 | Disposition: A | Discharge status: Home / Self Care | Payer: Medicare HMO | Source: Ambulatory Visit | Attending: Surgery | Admitting: Surgery

## 2019-09-22 ENCOUNTER — Other Ambulatory Visit: Payer: Self-pay

## 2019-09-22 DIAGNOSIS — Z20828 Contact with and (suspected) exposure to other viral communicable diseases: Secondary | ICD-10-CM | POA: Insufficient documentation

## 2019-09-22 DIAGNOSIS — K8080 Other cholelithiasis without obstruction: Secondary | ICD-10-CM | POA: Diagnosis not present

## 2019-09-22 DIAGNOSIS — Z01812 Encounter for preprocedural laboratory examination: Secondary | ICD-10-CM | POA: Diagnosis not present

## 2019-09-22 LAB — SARS CORONAVIRUS 2 (TAT 6-24 HRS): SARS Coronavirus 2: NEGATIVE

## 2019-09-23 ENCOUNTER — Encounter: Payer: Self-pay | Admitting: *Deleted

## 2019-09-24 ENCOUNTER — Encounter: Payer: Self-pay | Admitting: Anesthesiology

## 2019-09-24 MED ORDER — INDOCYANINE GREEN 25 MG IV SOLR
7.5000 mg | Freq: Once | INTRAVENOUS | Status: AC
  Administered 2019-09-25: 7.5 mg via INTRAVENOUS
  Filled 2019-09-24: qty 7.5
  Filled 2019-09-24: qty 25

## 2019-09-25 ENCOUNTER — Ambulatory Visit: Payer: Medicare HMO | Admitting: Anesthesiology

## 2019-09-25 ENCOUNTER — Other Ambulatory Visit: Payer: Self-pay

## 2019-09-25 ENCOUNTER — Encounter
Admission: RE | Disposition: A | Discharge status: Home / Self Care | Payer: Self-pay | Source: Home / Self Care | Attending: Surgery

## 2019-09-25 ENCOUNTER — Ambulatory Visit
Admission: RE | Admit: 2019-09-25 | Discharge: 2019-09-25 | Disposition: A | Discharge status: Home / Self Care | Payer: Medicare HMO | Attending: Surgery | Admitting: Surgery

## 2019-09-25 DIAGNOSIS — F329 Major depressive disorder, single episode, unspecified: Secondary | ICD-10-CM | POA: Insufficient documentation

## 2019-09-25 DIAGNOSIS — Z9884 Bariatric surgery status: Secondary | ICD-10-CM | POA: Insufficient documentation

## 2019-09-25 DIAGNOSIS — K8020 Calculus of gallbladder without cholecystitis without obstruction: Secondary | ICD-10-CM

## 2019-09-25 DIAGNOSIS — K8010 Calculus of gallbladder with chronic cholecystitis without obstruction: Secondary | ICD-10-CM | POA: Diagnosis not present

## 2019-09-25 DIAGNOSIS — J449 Chronic obstructive pulmonary disease, unspecified: Secondary | ICD-10-CM | POA: Diagnosis not present

## 2019-09-25 DIAGNOSIS — I10 Essential (primary) hypertension: Secondary | ICD-10-CM | POA: Diagnosis not present

## 2019-09-25 DIAGNOSIS — F419 Anxiety disorder, unspecified: Secondary | ICD-10-CM | POA: Insufficient documentation

## 2019-09-25 DIAGNOSIS — Z86718 Personal history of other venous thrombosis and embolism: Secondary | ICD-10-CM | POA: Diagnosis not present

## 2019-09-25 DIAGNOSIS — Z79899 Other long term (current) drug therapy: Secondary | ICD-10-CM | POA: Insufficient documentation

## 2019-09-25 SURGERY — CHOLECYSTECTOMY, ROBOT-ASSISTED, LAPAROSCOPIC
Anesthesia: General

## 2019-09-25 MED ORDER — LIDOCAINE HCL (PF) 2 % IJ SOLN
INTRAMUSCULAR | Status: AC
  Filled 2019-09-25: qty 10

## 2019-09-25 MED ORDER — HYDROMORPHONE HCL 1 MG/ML IJ SOLN
0.5000 mg | INTRAMUSCULAR | Status: AC | PRN
  Administered 2019-09-25 (×4): 0.5 mg via INTRAVENOUS

## 2019-09-25 MED ORDER — FENTANYL CITRATE (PF) 100 MCG/2ML IJ SOLN
INTRAMUSCULAR | Status: AC
  Filled 2019-09-25: qty 2

## 2019-09-25 MED ORDER — SUGAMMADEX SODIUM 200 MG/2ML IV SOLN
INTRAVENOUS | Status: DC | PRN
  Administered 2019-09-25: 300 mg via INTRAVENOUS

## 2019-09-25 MED ORDER — ROCURONIUM BROMIDE 50 MG/5ML IV SOLN
INTRAVENOUS | Status: AC
  Filled 2019-09-25: qty 2

## 2019-09-25 MED ORDER — HYDROMORPHONE HCL 1 MG/ML IJ SOLN
INTRAMUSCULAR | Status: AC
  Administered 2019-09-25: 0.5 mg via INTRAVENOUS
  Filled 2019-09-25: qty 1

## 2019-09-25 MED ORDER — BUPIVACAINE-EPINEPHRINE (PF) 0.5% -1:200000 IJ SOLN
INTRAMUSCULAR | Status: AC
  Filled 2019-09-25: qty 30

## 2019-09-25 MED ORDER — DEXAMETHASONE SODIUM PHOSPHATE 10 MG/ML IJ SOLN
INTRAMUSCULAR | Status: AC
  Filled 2019-09-25: qty 1

## 2019-09-25 MED ORDER — LIDOCAINE HCL (CARDIAC) PF 100 MG/5ML IV SOSY
PREFILLED_SYRINGE | INTRAVENOUS | Status: DC | PRN
  Administered 2019-09-25: 60 mg via INTRAVENOUS

## 2019-09-25 MED ORDER — GABAPENTIN 300 MG PO CAPS
ORAL_CAPSULE | ORAL | Status: AC
  Administered 2019-09-25: 300 mg via ORAL
  Filled 2019-09-25: qty 1

## 2019-09-25 MED ORDER — CELECOXIB 200 MG PO CAPS
200.0000 mg | ORAL_CAPSULE | ORAL | Status: AC
  Administered 2019-09-25: 200 mg via ORAL

## 2019-09-25 MED ORDER — ACETAMINOPHEN 500 MG PO TABS
1000.0000 mg | ORAL_TABLET | ORAL | Status: AC
  Administered 2019-09-25: 07:00:00 1000 mg via ORAL

## 2019-09-25 MED ORDER — PROPOFOL 10 MG/ML IV BOLUS
INTRAVENOUS | Status: DC | PRN
  Administered 2019-09-25: 150 mg via INTRAVENOUS

## 2019-09-25 MED ORDER — DEXAMETHASONE SODIUM PHOSPHATE 10 MG/ML IJ SOLN
INTRAMUSCULAR | Status: DC | PRN
  Administered 2019-09-25: 8 mg via INTRAVENOUS

## 2019-09-25 MED ORDER — FENTANYL CITRATE (PF) 100 MCG/2ML IJ SOLN
INTRAMUSCULAR | Status: AC
  Administered 2019-09-25: 25 ug via INTRAVENOUS
  Filled 2019-09-25: qty 2

## 2019-09-25 MED ORDER — EPHEDRINE SULFATE 50 MG/ML IJ SOLN
INTRAMUSCULAR | Status: DC | PRN
  Administered 2019-09-25 (×2): 10 mg via INTRAVENOUS

## 2019-09-25 MED ORDER — CHLORHEXIDINE GLUCONATE CLOTH 2 % EX PADS: 6.0000 | MEDICATED_PAD | Freq: Once | CUTANEOUS | Status: DC

## 2019-09-25 MED ORDER — ONDANSETRON HCL 4 MG/2ML IJ SOLN
INTRAMUSCULAR | Status: DC | PRN
  Administered 2019-09-25: 4 mg via INTRAVENOUS

## 2019-09-25 MED ORDER — GLYCOPYRROLATE 0.2 MG/ML IJ SOLN
INTRAMUSCULAR | Status: DC | PRN
  Administered 2019-09-25: 0.2 mg via INTRAVENOUS

## 2019-09-25 MED ORDER — MIDAZOLAM HCL 2 MG/2ML IJ SOLN
INTRAMUSCULAR | Status: AC
  Filled 2019-09-25: qty 2

## 2019-09-25 MED ORDER — ONDANSETRON HCL 4 MG/2ML IJ SOLN: 4.0000 mg | Freq: Once | INTRAMUSCULAR | Status: DC | PRN

## 2019-09-25 MED ORDER — BUPIVACAINE-EPINEPHRINE (PF) 0.5% -1:200000 IJ SOLN
INTRAMUSCULAR | Status: DC | PRN
  Administered 2019-09-25: 30 mL

## 2019-09-25 MED ORDER — DEXMEDETOMIDINE HCL 200 MCG/2ML IV SOLN
INTRAVENOUS | Status: DC | PRN
  Administered 2019-09-25: 8 ug via INTRAVENOUS
  Administered 2019-09-25 (×3): 4 ug via INTRAVENOUS
  Administered 2019-09-25: 8 ug via INTRAVENOUS
  Administered 2019-09-25: 4 ug via INTRAVENOUS

## 2019-09-25 MED ORDER — LACTATED RINGERS IV SOLN
INTRAVENOUS | Status: DC
  Administered 2019-09-25: 07:00:00 via INTRAVENOUS

## 2019-09-25 MED ORDER — FENTANYL CITRATE (PF) 100 MCG/2ML IJ SOLN
INTRAMUSCULAR | Status: DC | PRN
  Administered 2019-09-25 (×2): 50 ug via INTRAVENOUS
  Administered 2019-09-25 (×2): 25 ug via INTRAVENOUS
  Administered 2019-09-25: 50 ug via INTRAVENOUS

## 2019-09-25 MED ORDER — MIDAZOLAM HCL 2 MG/2ML IJ SOLN
INTRAMUSCULAR | Status: DC | PRN
  Administered 2019-09-25: 2 mg via INTRAVENOUS

## 2019-09-25 MED ORDER — KETOROLAC TROMETHAMINE 30 MG/ML IJ SOLN
30.0000 mg | Freq: Once | INTRAMUSCULAR | Status: AC
  Administered 2019-09-25: 10:00:00 30 mg via INTRAVENOUS

## 2019-09-25 MED ORDER — ACETAMINOPHEN 500 MG PO TABS
ORAL_TABLET | ORAL | Status: AC
  Administered 2019-09-25: 1000 mg via ORAL
  Filled 2019-09-25: qty 2

## 2019-09-25 MED ORDER — ROCURONIUM BROMIDE 100 MG/10ML IV SOLN
INTRAVENOUS | Status: DC | PRN
  Administered 2019-09-25: 35 mg via INTRAVENOUS
  Administered 2019-09-25: 15 mg via INTRAVENOUS
  Administered 2019-09-25: 10 mg via INTRAVENOUS

## 2019-09-25 MED ORDER — HYDROCODONE-ACETAMINOPHEN 5-325 MG PO TABS: 1.0000 | ORAL_TABLET | ORAL | 0 refills | Status: DC | PRN

## 2019-09-25 MED ORDER — KETOROLAC TROMETHAMINE 30 MG/ML IJ SOLN
INTRAMUSCULAR | Status: AC
  Administered 2019-09-25: 30 mg via INTRAVENOUS
  Filled 2019-09-25: qty 1

## 2019-09-25 MED ORDER — CEFAZOLIN SODIUM-DEXTROSE 2-4 GM/100ML-% IV SOLN
2.0000 g | INTRAVENOUS | Status: AC
  Administered 2019-09-25: 08:00:00 2 g via INTRAVENOUS

## 2019-09-25 MED ORDER — GABAPENTIN 300 MG PO CAPS
300.0000 mg | ORAL_CAPSULE | ORAL | Status: AC
  Administered 2019-09-25: 07:00:00 300 mg via ORAL

## 2019-09-25 MED ORDER — FENTANYL CITRATE (PF) 100 MCG/2ML IJ SOLN
25.0000 ug | INTRAMUSCULAR | Status: DC | PRN
  Administered 2019-09-25 (×4): 25 ug via INTRAVENOUS

## 2019-09-25 MED ORDER — PROPOFOL 10 MG/ML IV BOLUS
INTRAVENOUS | Status: AC
  Filled 2019-09-25: qty 40

## 2019-09-25 MED ORDER — CELECOXIB 200 MG PO CAPS
ORAL_CAPSULE | ORAL | Status: AC
  Filled 2019-09-25: qty 1

## 2019-09-25 MED ORDER — ONDANSETRON HCL 4 MG/2ML IJ SOLN
INTRAMUSCULAR | Status: AC
  Filled 2019-09-25: qty 2

## 2019-09-25 MED ORDER — CEFAZOLIN SODIUM-DEXTROSE 2-4 GM/100ML-% IV SOLN
INTRAVENOUS | Status: AC
  Filled 2019-09-25: qty 100

## 2019-09-25 SURGICAL SUPPLY — 43 items
CANISTER SUCT 1200ML W/VALVE (MISCELLANEOUS) ×2 IMPLANT
CHLORAPREP W/TINT 26 (MISCELLANEOUS) ×2 IMPLANT
CLIP VESOLOCK MED LG 6/CT (CLIP) ×2 IMPLANT
COVER WAND RF STERILE (DRAPES) IMPLANT
DECANTER SPIKE VIAL GLASS SM (MISCELLANEOUS) IMPLANT
DEFOGGER SCOPE WARMER CLEARIFY (MISCELLANEOUS) ×2 IMPLANT
DERMABOND ADVANCED (GAUZE/BANDAGES/DRESSINGS) ×1
DERMABOND ADVANCED .7 DNX12 (GAUZE/BANDAGES/DRESSINGS) ×1 IMPLANT
DRAPE 3/4 80X56 (DRAPES) ×2 IMPLANT
DRAPE ARM DVNC X/XI (DISPOSABLE) ×4 IMPLANT
DRAPE COLUMN DVNC XI (DISPOSABLE) ×1 IMPLANT
DRAPE DA VINCI XI ARM (DISPOSABLE) ×4
DRAPE DA VINCI XI COLUMN (DISPOSABLE) ×1
ELECT CAUTERY BLADE 6.4 (BLADE) ×2 IMPLANT
ELECT REM PT RETURN 9FT ADLT (ELECTROSURGICAL) ×2
ELECTRODE REM PT RTRN 9FT ADLT (ELECTROSURGICAL) ×1 IMPLANT
GLOVE BIO SURGEON STRL SZ7 (GLOVE) ×10 IMPLANT
GOWN STRL REUS W/ TWL LRG LVL3 (GOWN DISPOSABLE) ×4 IMPLANT
GOWN STRL REUS W/TWL LRG LVL3 (GOWN DISPOSABLE) ×4
IRRIGATION STRYKERFLOW (MISCELLANEOUS) IMPLANT
IRRIGATOR STRYKERFLOW (MISCELLANEOUS)
IV NS 1000ML (IV SOLUTION)
IV NS 1000ML BAXH (IV SOLUTION) IMPLANT
KIT IMAGING PINPOINTPAQ (MISCELLANEOUS) ×2 IMPLANT
KIT PINK PAD W/HEAD ARE REST (MISCELLANEOUS) ×2
KIT PINK PAD W/HEAD ARM REST (MISCELLANEOUS) ×1 IMPLANT
LABEL OR SOLS (LABEL) ×2 IMPLANT
NEEDLE HYPO 22GX1.5 SAFETY (NEEDLE) ×2 IMPLANT
NS IRRIG 500ML POUR BTL (IV SOLUTION) ×2 IMPLANT
OBTURATOR OPTICAL STANDARD 8MM (TROCAR) ×1
OBTURATOR OPTICAL STND 8 DVNC (TROCAR) ×1
OBTURATOR OPTICALSTD 8 DVNC (TROCAR) ×1 IMPLANT
PACK LAP CHOLECYSTECTOMY (MISCELLANEOUS) ×2 IMPLANT
PENCIL ELECTRO HAND CTR (MISCELLANEOUS) ×2 IMPLANT
POUCH SPECIMEN RETRIEVAL 10MM (ENDOMECHANICALS) ×2 IMPLANT
SEAL CANN UNIV 5-8 DVNC XI (MISCELLANEOUS) ×4 IMPLANT
SEAL XI 5MM-8MM UNIVERSAL (MISCELLANEOUS) ×4
SOLUTION ELECTROLUBE (MISCELLANEOUS) ×2 IMPLANT
SPONGE LAP 18X18 RF (DISPOSABLE) ×2 IMPLANT
SUT MNCRL AB 4-0 PS2 18 (SUTURE) ×4 IMPLANT
SUT VICRYL 0 AB UR-6 (SUTURE) ×4 IMPLANT
TROCAR 130MM GELPORT  DAV (MISCELLANEOUS) ×2 IMPLANT
TUBING EVAC SMOKE HEATED PNEUM (TUBING) ×2 IMPLANT

## 2019-09-25 NOTE — Anesthesia Procedure Notes (Signed)
Procedure Name: Intubation Date/Time: 09/25/2019 7:40 AM Performed by: Cory Munch, RN Pre-anesthesia Checklist: Patient identified, Emergency Drugs available, Suction available and Patient being monitored Patient Re-evaluated:Patient Re-evaluated prior to induction Oxygen Delivery Method: Circle system utilized Preoxygenation: Pre-oxygenation with 100% oxygen Induction Type: IV induction Ventilation: Mask ventilation without difficulty Laryngoscope Size: Mac and 3 Grade View: Grade II Tube type: Oral Tube size: 7.0 mm Number of attempts: 1 Airway Equipment and Method: Stylet and Oral airway Placement Confirmation: ETT inserted through vocal cords under direct vision,  positive ETCO2 and breath sounds checked- equal and bilateral Secured at: 22 cm Tube secured with: Tape Dental Injury: Teeth and Oropharynx as per pre-operative assessment

## 2019-09-25 NOTE — Anesthesia Preprocedure Evaluation (Signed)
Anesthesia Evaluation  Patient identified by MRN, date of birth, ID band Patient awake    Reviewed: Allergy & Precautions, NPO status , Patient's Chart, lab work & pertinent test results, reviewed documented beta blocker date and time   Airway Mallampati: III  TM Distance: >3 FB     Dental  (+) Chipped   Pulmonary asthma , COPD,           Cardiovascular hypertension, Pt. on medications      Neuro/Psych  Headaches, PSYCHIATRIC DISORDERS Anxiety Depression    GI/Hepatic hiatal hernia, GERD  Controlled,  Endo/Other    Renal/GU      Musculoskeletal  (+) Arthritis ,   Abdominal   Peds  Hematology  (+) anemia ,   Anesthesia Other Findings Gastric bypass. Chronic pain.  Reproductive/Obstetrics                             Anesthesia Physical Anesthesia Plan  ASA: III  Anesthesia Plan: General   Post-op Pain Management:    Induction: Intravenous  PONV Risk Score and Plan:   Airway Management Planned: Oral ETT  Additional Equipment:   Intra-op Plan:   Post-operative Plan:   Informed Consent: I have reviewed the patients History and Physical, chart, labs and discussed the procedure including the risks, benefits and alternatives for the proposed anesthesia with the patient or authorized representative who has indicated his/her understanding and acceptance.       Plan Discussed with: CRNA  Anesthesia Plan Comments:         Anesthesia Quick Evaluation

## 2019-09-25 NOTE — Op Note (Signed)
Robotic assisted laparoscopic Cholecystectomy  Pre-operative Diagnosis: Biliary colic  Post-operative Diagnosis: same  Procedure:  Robotic assisted laparoscopic Cholecystectomy  Surgeon: Caroleen Hamman, MD FACS  Anesthesia: Gen. with endotracheal tube  Findings: Chronic mild Cholecystitis   Estimated Blood Loss: 5 cc       Specimens: Gallbladder           Complications: none   Procedure Details  The patient was seen again in the Holding Room. The benefits, complications, treatment options, and expected outcomes were discussed with the patient. The risks of bleeding, infection, recurrence of symptoms, failure to resolve symptoms, bile duct damage, bile duct leak, retained common bile duct stone, bowel injury, any of which could require further surgery and/or ERCP, stent, or papillotomy were reviewed with the patient. The likelihood of improving the patient's symptoms with return to their baseline status is good.  The patient and/or family concurred with the proposed plan, giving informed consent.  The patient was taken to Operating Room, identified  and the procedure verified as Laparoscopic Cholecystectomy.  A Time Out was held and the above information confirmed.  Prior to the induction of general anesthesia, antibiotic prophylaxis was administered. VTE prophylaxis was in place. General endotracheal anesthesia was then administered and tolerated well. After the induction, the abdomen was prepped with Chloraprep and draped in the sterile fashion. The patient was positioned in the supine position.  Cut down technique was used to enter the abdominal cavity and a Hasson trochar was placed after two vicryl stitches were anchored to the fascia. Pneumoperitoneum was then created with CO2 and tolerated well without any adverse changes in the patient's vital signs.  Three 8-mm ports were placed under direct vision. All skin incisions  were infiltrated with a local anesthetic agent before making the  incision and placing the trocars.   The patient was positioned  in reverse Trendelenburg, robot was brought to the surgical field and docked in the standard fashion.  We made sure all the instrumentation was kept indirect view at all times and that there were no collision between the arms. I scrubbed out and went to the console.  The gallbladder was identified, the fundus grasped and retracted cephalad. Adhesions were lysed bluntly. The infundibulum was grasped and retracted laterally, exposing the peritoneum overlying the triangle of Calot. This was then divided and exposed in a blunt fashion. An extended critical view of the cystic duct and cystic artery was obtained.  The cystic duct was clearly identified and bluntly dissected.   Artery and duct were double clipped and divided. Using ICG cholangiography we visualize the cystic duct and no abnormal biliary ductal anatomy or evidence of bile injuries. The gallbladder was taken from the gallbladder fossa in a retrograde fashion with the electrocautery.  Hemostasis was achieved with the electrocautery. nspection of the right upper quadrant was performed. No bleeding, bile duct injury or leak, or bowel injury was noted. Robotic instruments and robotic arms were undocked in the standard fashion.  I scrubbed back in.  The gallbladder was removed and placed in an Endocatch bag.   Pneumoperitoneum was released.  The periumbilical port site was closed with interrumpted 0 Vicryl sutures. 4-0 subcuticular Monocryl was used to close the skin. Dermabond was  applied.  The patient was then extubated and brought to the recovery room in stable condition. Sponge, lap, and needle counts were correct at closure and at the conclusion of the case.  Caroleen Hamman, MD, FACS

## 2019-09-25 NOTE — Interval H&P Note (Signed)
History and Physical Interval Note:  09/25/2019 7:24 AM  Melinda Harrison  has presented today for surgery, with the diagnosis of gallstones.  The various methods of treatment have been discussed with the patient and family. After consideration of risks, benefits and other options for treatment, the patient has consented to  Procedure(s): XI ROBOTIC [place] (N/A) as a surgical intervention.  [name] history has been reviewed, patient examined, no change in status, stable for surgery.  I have reviewed the patient's chart and labs.  Questions were answered to the patient's satisfaction.     [place]

## 2019-09-25 NOTE — Anesthesia Post-op Follow-up Note (Signed)
Anesthesia QCDR form completed.        

## 2019-09-25 NOTE — Progress Notes (Signed)
Pt rates abdominal pain 7 out of 10. Pt sleeping between care. Resting comfortably. VSS. I spoke with Dr. Dahlia Byes to make aware of pain. MD acknowledged with nothing needed at this time.

## 2019-09-25 NOTE — Progress Notes (Signed)
Pt up to chair in PACU; rates pain 5 out of 10, stating pain is better and tolerating fluids. Pt sleeping /resting comfortably between care. Face relaxed.  VSS. Dr. Dahlia Byes and Dr. Marcello Moores aware. No additional orders given.  Transported to post op.

## 2019-09-25 NOTE — Discharge Instructions (Addendum)
AMBULATORY SURGERY  DISCHARGE INSTRUCTIONS   1) The drugs that you were given will stay in your system until tomorrow so for the next 24 hours you should not:  A) Drive an automobile B) Make any legal decisions C) Drink any alcoholic beverage   2) You may resume regular meals tomorrow.  Today it is better to start with liquids and gradually work up to solid foods.  You may eat anything you prefer, but it is better to start with liquids, then soup and crackers, and gradually work up to solid foods.   3) Please notify your doctor immediately if you have any unusual bleeding, trouble breathing, redness and pain at the surgery site, drainage, fever, or pain not relieved by medication.  4) Your post-operative visit with Dr.                     : Laparoscopic Cholecystectomy, Care After   These instructions give you information on caring for yourself after your procedure. Your doctor may also give you more specific instructions. Call your doctor if you have any problems or questions after your procedure.  HOME CARE  Change your bandages (dressings) as told by your doctor.  Keep the wound dry and clean. Wash the wound gently with soap and water. Pat the wound dry with a clean towel.  Do not take baths, swim, or use hot tubs for 2 weeks, or as told by your doctor.  Only take medicine as told by your doctor.  Eat a normal diet as told by your doctor.  Do not lift anything heavier than 10 pounds (4.5 kg) until your doctor says it is okay.  Do not play contact sports for 1 week, or as told by your doctor. GET HELP IF:  Your wound is red, puffy (swollen), or painful.  You have yellowish-white fluid (pus) coming from the wound.  You have fluid draining from the wound for more than 1 day.  You have a bad smell coming from the wound.  Your wound breaks open. GET HELP RIGHT AWAY IF:  You have trouble breathing.  You have chest pain.  You have a fever >101  You have pain in the shoulders  (shoulder strap areas) that is getting worse.  You feel dizzy or pass out (faint).  You have severe belly (abdominal) pain.  You feel sick to your stomach (nauseous) or throw up (vomit) for more than 1 day.

## 2019-09-25 NOTE — Anesthesia Postprocedure Evaluation (Signed)
Anesthesia Post Note  Patient: Melinda Harrison  Procedure(s) Performed: XI ROBOTIC ASSISTED LAPAROSCOPIC CHOLECYSTECTOMY (N/A ) INDOCYANINE GREEN FLUORESCENCE IMAGING (ICG) (N/A )  Patient location during evaluation: PACU Anesthesia Type: General Level of consciousness: awake and alert Pain management: pain level controlled Vital Signs Assessment: post-procedure vital signs reviewed and stable Respiratory status: spontaneous breathing, nonlabored ventilation, respiratory function stable and patient connected to nasal cannula oxygen Cardiovascular status: blood pressure returned to baseline and stable Postop Assessment: no apparent nausea or vomiting Anesthetic complications: no     Last Vitals:  Vitals:   09/25/19 1113 09/25/19 1140  BP: (!) 100/50 95/60  Pulse: 93 (!) 8  Resp: 18 16  Temp: 36.6 C   SpO2: 94% 95%    Last Pain:  Vitals:   09/25/19 1140  TempSrc:   PainSc: [place]

## 2019-09-25 NOTE — Transfer of Care (Signed)
Immediate Anesthesia Transfer of Care Note  Patient: Melinda Harrison  Procedure(s) Performed: XI ROBOTIC ASSISTED LAPAROSCOPIC CHOLECYSTECTOMY (N/A ) INDOCYANINE GREEN FLUORESCENCE IMAGING (ICG) (N/A )  Patient Location: PACU  Anesthesia Type:General  Level of Consciousness: drowsy and patient cooperative  Airway & Oxygen Therapy: Patient Spontanous Breathing and Patient connected to face mask oxygen  Post-op Assessment: Report given to RN and Post -op Vital signs reviewed and stable  Post vital signs: Reviewed and stable  Last Vitals:  Vitals Value Taken Time  BP 134/78 09/25/19 0856  Temp 36.6 C 09/25/19 0856  Pulse 89 09/25/19 0858  Resp 17 09/25/19 0858  SpO2 100 % 09/25/19 0858  Vitals shown include unvalidated device data.  Last Pain:  Vitals:   09/25/19 0636  TempSrc: Temporal  PainSc: 7          Complications: No apparent anesthesia complications

## 2019-09-26 LAB — SURGICAL PATHOLOGY

## 2019-10-08 ENCOUNTER — Ambulatory Visit (INDEPENDENT_AMBULATORY_CARE_PROVIDER_SITE_OTHER): Payer: Medicare HMO | Admitting: Physician Assistant

## 2019-10-08 ENCOUNTER — Encounter: Payer: Self-pay | Admitting: Physician Assistant

## 2019-10-08 ENCOUNTER — Other Ambulatory Visit: Payer: Self-pay

## 2019-10-08 VITALS — BP 140/84 | HR 81 | Temp 97.7°F | Ht 66.0 in | Wt 184.0 lb

## 2019-10-08 DIAGNOSIS — K8020 Calculus of gallbladder without cholecystitis without obstruction: Secondary | ICD-10-CM

## 2019-10-08 DIAGNOSIS — Z09 Encounter for follow-up examination after completed treatment for conditions other than malignant neoplasm: Secondary | ICD-10-CM

## 2019-10-08 NOTE — Progress Notes (Signed)
[facility] POST-OP OFFICE VISIT  10/08/2019  HPI: Melinda Harrison is a 53 y.o. female 13 days s/p robotic assisted laparoscopic cholecystectomy with Dr Dahlia Byes.    Today, she reports that she is doing well. Still with abdominal soreness at incisions. Improved with tylenol. No fever, chills, nausea, emesis, diarrhea. Tolerating PO. Mobilizing well. No other issues.   Vital signs: LMP 03/09/2016    Physical Exam: Constitutional: Well appearing female, NAD Abdomen: Soft, non-tender, non-distended, no rebound/guarding Skin: Laparoscopic incisions are CDI without erythema or drainage  Assessment/Plan: This is a 52 y.o. female 13 days s/p robotic assisted laparoscopic cholecystectomy   - Tylenol for pain control  - okay to submerge wounds  - complete 2 weeks further of lifting restrictions  - Reviewed Pathology: Chronic cholecystitis with cholelithiasis  - rtc prn  -- [name], PA-C [facility] 10/08/2019, 9:28 AM [phone] M-F: 7am - 4pm

## 2019-10-08 NOTE — Patient Instructions (Addendum)
Patient may take Tylenol up to 4000-mg every day, every six hours for the pain and discomfort. Patient may take baths. Please keep an eye out for redness in the area. If you have any questions or concerns, please feel free to contact our office.  Please refrain for heavy lifting for two more weeks.   Cholecystostomy, Care After This sheet gives you information about how to care for yourself after your procedure. Your health care provider may also give you more specific instructions. If you have problems or questions, contact your health care provider. What can I expect after the procedure? After your procedure, it is common to have soreness near the incision site of your drainage tube (catheter). Follow these instructions at home: Incision care   Follow instructions from your health care provider about how to take care of your incision site where the catheter was inserted. Make sure you: ? Wash your hands with soap and water before and after you change your bandage (dressing). If soap and water are not available, use hand sanitizer. ? Change your dressing as told by your health care provider.  Check the incision site every day for signs of infection. Check for: ? Redness, swelling, or pain. ? Fluid or blood. ? Warmth. ? Pus or a bad smell.  Do not take baths, swim, or use a hot tub until your health care provider approves. Ask your health care provider if you may take showers. You may only be allowed to take sponge baths. General instructions  Follow instructions from your health care provider about how to care for your catheter and collection bag at home.  Your health care provider will show you: ? How to record the amount of drainage from the catheter. ? How to flush the catheter. ? How to care for the catheter incision site.  Follow instructions from your health care provider about eating or drinking restrictions.  Take over-the-counter and prescription medicines only as told by  your health care provider.  Keep all follow-up visits as told by your health care provider. This is important. Contact a health care provider if:  You have redness, swelling, or pain around the catheter incision site.  You have nausea or vomiting. Get help right away if:  Your abdominal pain gets worse.  You feel dizzy or you faint while standing.  You have fluid or blood coming from the catheter incision site.  The area around the catheter incision site feels warm to the touch.  You have pus or a bad smell coming from the catheter incision site.  You have a fever.  You have shortness of breath.  You have a rapid heartbeat.  Your nausea or vomiting does not go away.  Your catheter becomes blocked.  Your catheter comes out of your abdomen. Summary  After your procedure, it is common to have soreness near the incision site of your drainage tube (catheter).  Wash your hands with soap and water before and after you change your bandage (dressing). Change your dressing as told by your health care provider.  Check the catheter incision site every day for signs of infection. Check for redness, swelling, pain, fluid, blood, warmth, pus, or a bad smell.  Contact your health care provider if you have nausea or vomiting, or if you have redness, swelling, or pain around your catheter incision site.  Get help right away if your abdominal pain gets worse, you feel dizzy, you have blood or fluid coming from the catheter incision site, you  have a fever, or you have shortness of breath. This information is not intended to replace advice given to you by your health care provider. Make sure you discuss any questions you have with your health care provider. Document Released: 09/01/2015 Document Revised: 07/08/2018 Document Reviewed: 07/08/2018 Elsevier Patient Education  2020 [employer].

## 2020-01-13 ENCOUNTER — Encounter: Payer: Self-pay | Admitting: Obstetrics and Gynecology

## 2020-03-25 ENCOUNTER — Ambulatory Visit: Payer: Medicare HMO | Attending: Internal Medicine

## 2020-03-25 DIAGNOSIS — Z23 Encounter for immunization: Secondary | ICD-10-CM

## 2020-03-25 NOTE — Progress Notes (Signed)
   Covid-19 Vaccination Clinic  Name:  HAE AHLERS    MRN: [id number] DOB: 12/09/1966  03/25/2020  Ms. Skiff was observed post Covid-19 immunization for 15 minutes without incident. She was provided with Vaccine Information Sheet and instruction to access the V-Safe system.   Ms. Gartland was instructed to call 911 with any severe reactions post vaccine: Marland Kitchen Difficulty breathing  . Swelling of face and throat  . A fast heartbeat  . A bad rash all over body  . Dizziness and weakness   Immunizations Administered    Name Date Dose VIS Date Route   Pfizer COVID-19 Vaccine 03/25/2020 11:56 AM 0.3 mL 12/05/2019 Intramuscular   Manufacturer: [employer], [employer]   Lot: [id number]   NDC: [address]-1

## 2020-04-21 ENCOUNTER — Ambulatory Visit: Payer: Medicare HMO | Attending: Internal Medicine

## 2020-04-21 DIAGNOSIS — Z23 Encounter for immunization: Secondary | ICD-10-CM

## 2020-04-21 NOTE — Progress Notes (Signed)
   Covid-19 Vaccination Clinic  Name:  Melinda Harrison    MRN: [id number] DOB: 1966/08/15  04/21/2020  Melinda Harrison was observed post Covid-19 immunization for 15 minutes without incident. She was provided with Vaccine Information Sheet and instruction to access the V-Safe system.   Melinda Harrison was instructed to call 911 with any severe reactions post vaccine: Marland Kitchen Difficulty breathing  . Swelling of face and throat  . A fast heartbeat  . A bad rash all over body  . Dizziness and weakness   Immunizations Administered    Name Date Dose VIS Date Route   Pfizer COVID-19 Vaccine 04/21/2020 12:08 PM 0.3 mL 02/18/2019 Intramuscular   Manufacturer: [employer], [employer]   Lot: [id number]   NDC: [address]-0

## 2021-04-14 IMAGING — CT CT ABD-PELV W/ CM
1 of 4 series · 13 of 32 positions shown, 18 images · IV contrast (APPLIED)
Comparison: Abdominal ultrasound 07/23/2015. Most recent
abdominopelvic CT of 02/11/2014.

CLINICAL DATA: Lower abdominal pain. History of gallstones and
kidney stones. Gastric bypass.

EXAM:
CT ABDOMEN AND PELVIS WITH CONTRAST
TECHNIQUE: Multidetector CT imaging of the abdomen and pelvis was performed
using the standard protocol following bolus administration of
intravenous contrast.
CONTRAST:  100mL OMNIPAQUE IOHEXOL 300 MG/ML  SOLN

[Series 4: axial st · axial · 0.74mm/px · z∈[-1181,-781]mm · 13 of 94 slices shown, 18 images]
[im 7/94  soft-tissue]
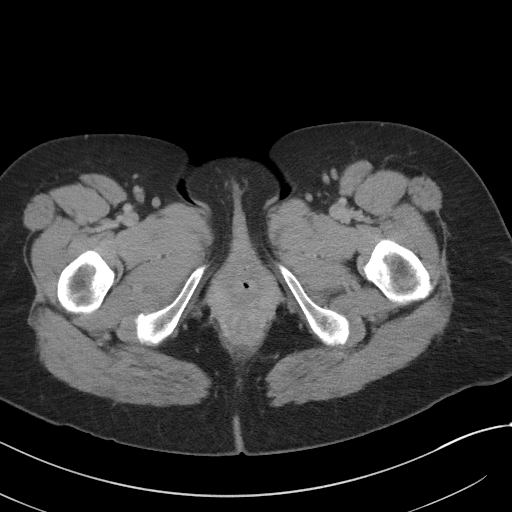
[im 7/94  bone]
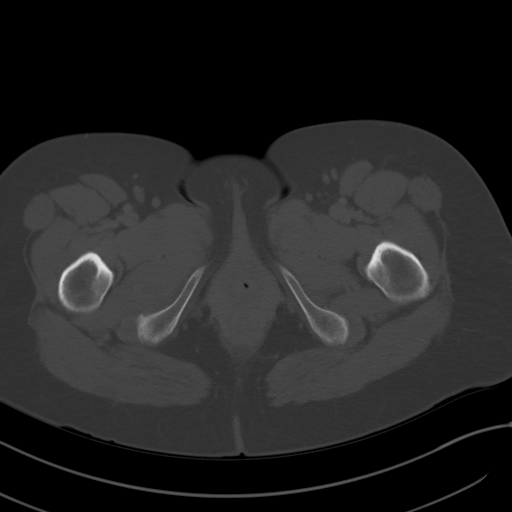
[im 13/94  soft-tissue]
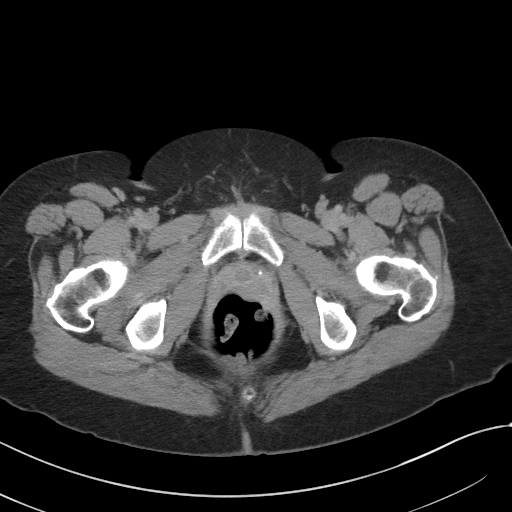
[im 19/94  soft-tissue]
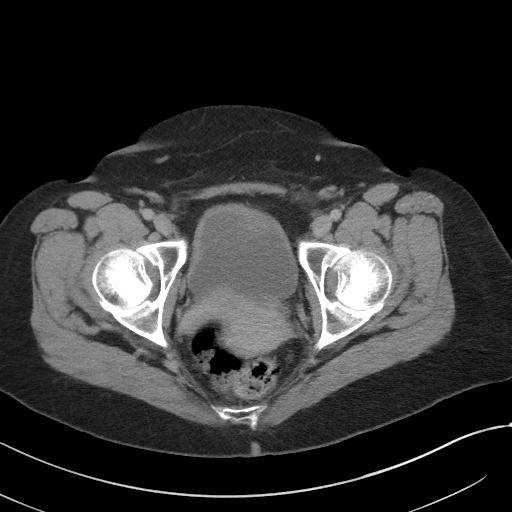
[im 32/94  soft-tissue]
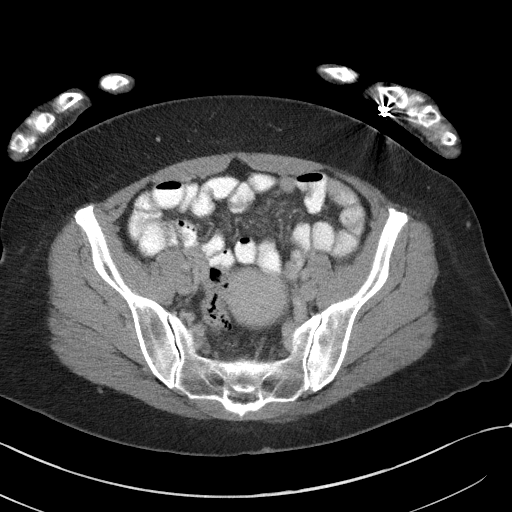
[im 38/94  soft-tissue]
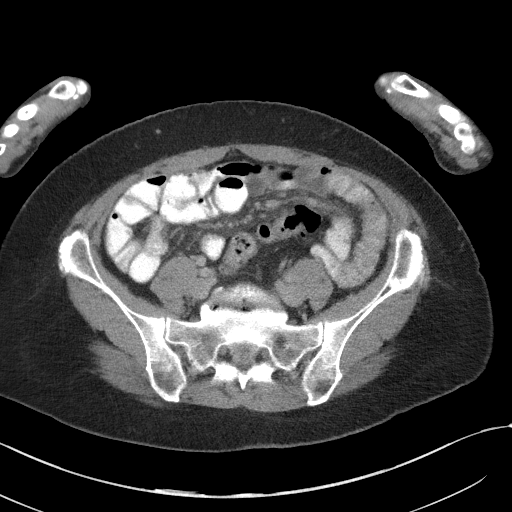
[im 44/94  soft-tissue]
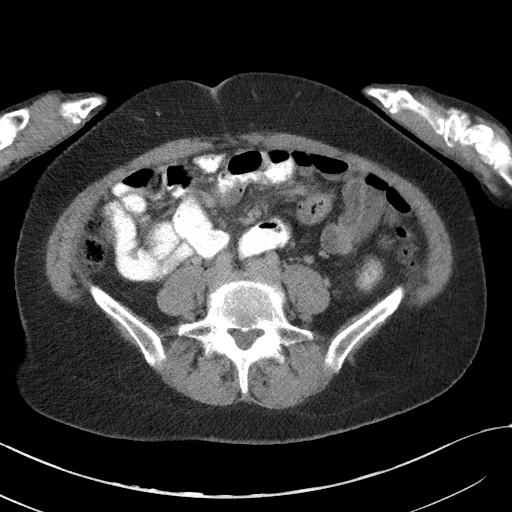
[im 50/94  soft-tissue]
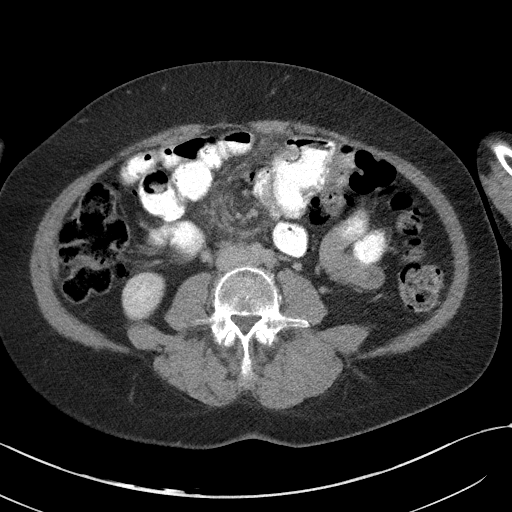
[im 56/94  soft-tissue]
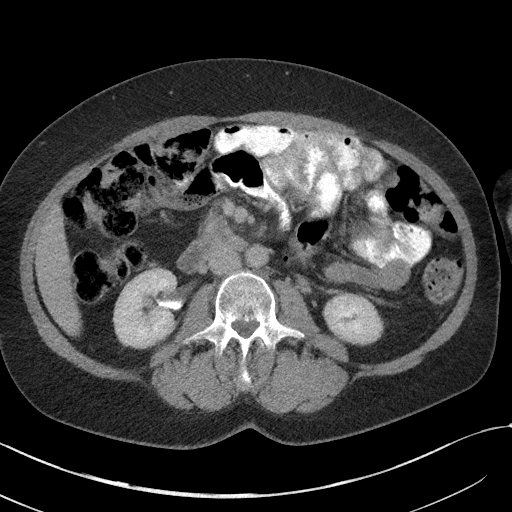
[im 63/94  soft-tissue]
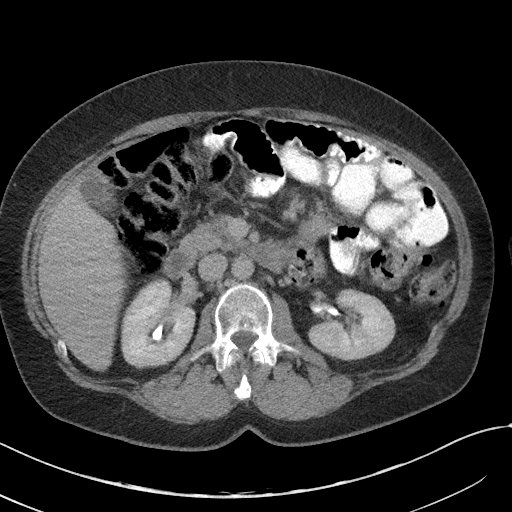
[im 63/94  bone]
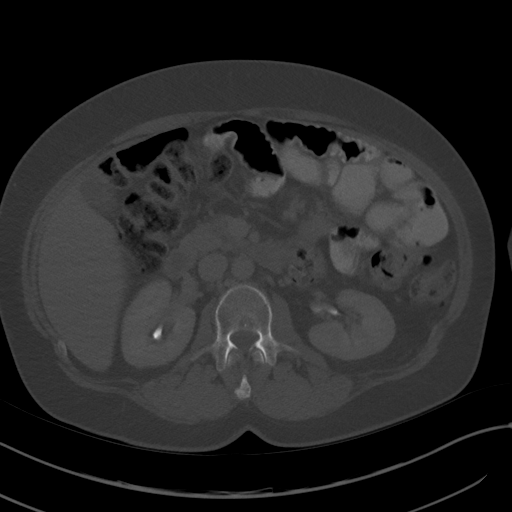
[im 69/94  lung]
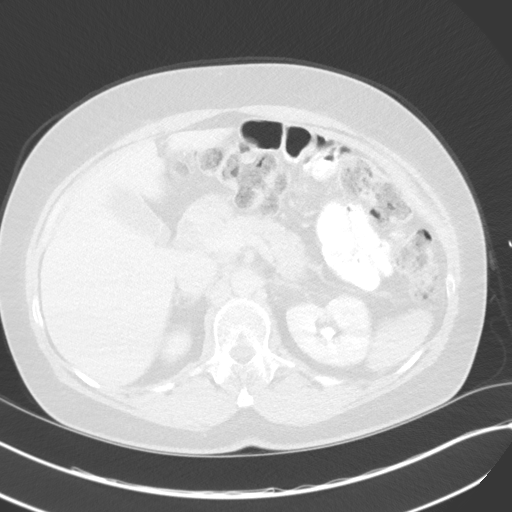
[im 75/94  soft-tissue]
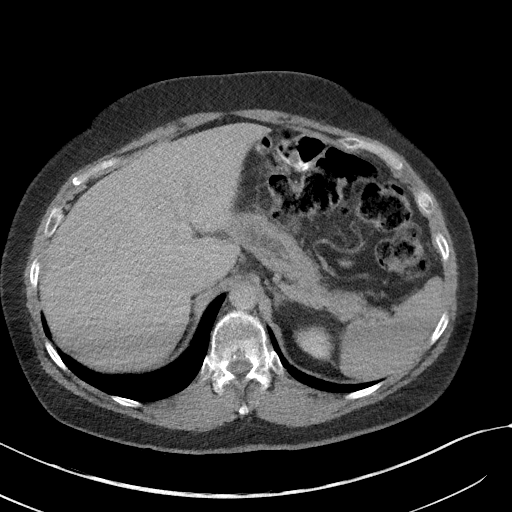
[im 75/94  lung]
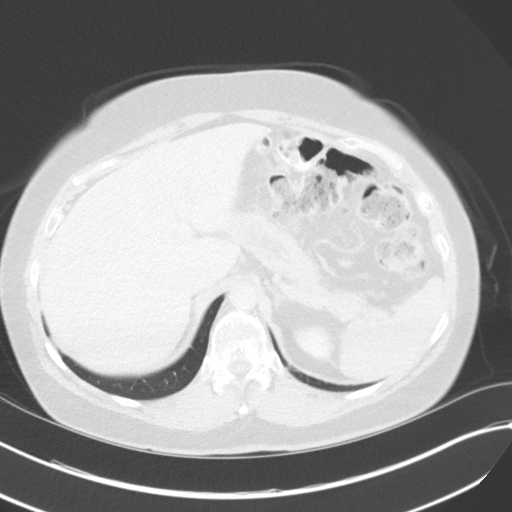
[im 81/94  soft-tissue]
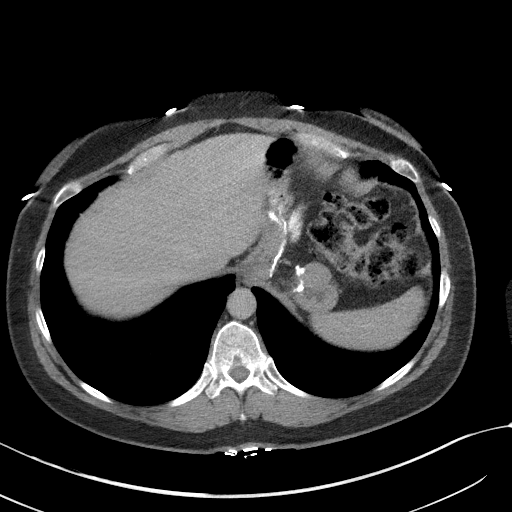
[im 81/94  lung]
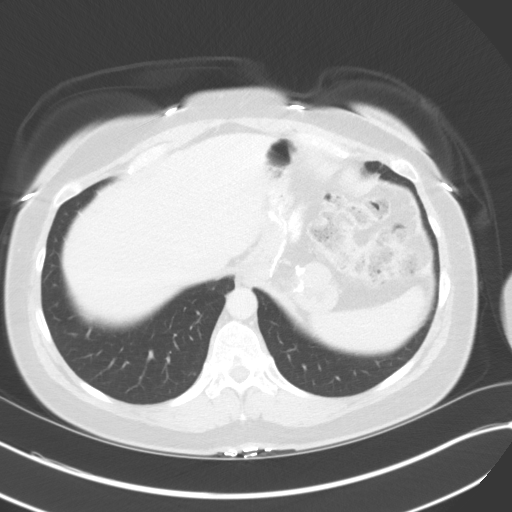
[im 87/94  soft-tissue]
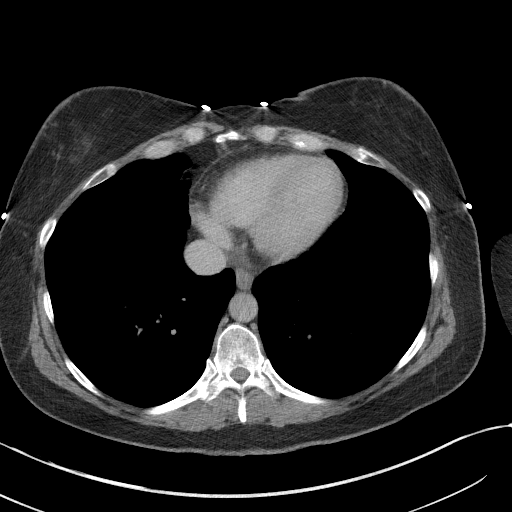
[im 87/94  lung]
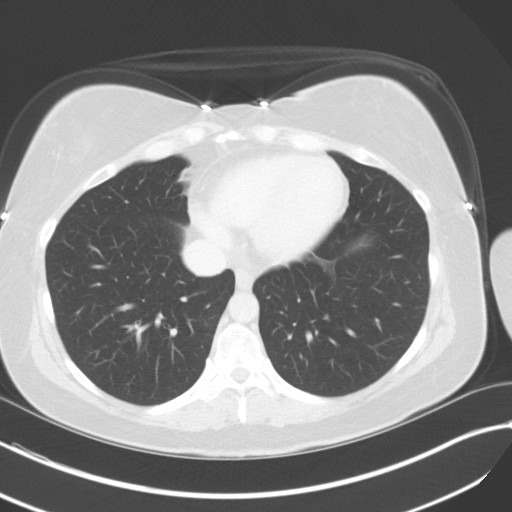

[13 of 32 positions shown; findings below may reference images not displayed]

FINDINGS: Lower chest: Clear lung bases. Normal heart size without pericardial
or pleural effusion.

Hepatobiliary: Mild limitations secondary to scanner malfunction and
resultant delayed imaging (with patient's arms not raised above the
head). Normal liver. Normal gallbladder, without biliary ductal
dilatation.

Pancreas: Normal, without mass or ductal dilatation.

Spleen: Normal in size, without focal abnormality.

Adrenals/Urinary Tract: Normal adrenal glands. Normal kidneys,
without hydronephrosis. Normal urinary bladder.

Stomach/Bowel: Status post antecolic Roux-en-Y gastric bypass. No
contrast within the bypassed stomach. No evidence of complicating
obstruction.

Colonic stool burden suggests constipation. Scattered colonic
diverticula. Normal terminal ileum. Otherwise normal small bowel.

Vascular/Lymphatic: Normal caliber of the aorta and branch vessels.
No abdominopelvic adenopathy.

Reproductive: Normal uterus and adnexa.

Other: Trace cul-de-sac fluid on 74/4.  No free intraperitoneal air.

Musculoskeletal: Degenerate disc disease at the lumbosacral
junction. Trace L4-5 anterolisthesis is new since 2931. A
mild-to-moderate T12 compression deformity is slightly increased.
IMPRESSION: 1. Mild limitations secondary to scanner malfunction and patient
positioning, as detailed above.
2.  No acute process in the abdomen or pelvis.
3.  Possible constipation.
4. Status post gastric bypass, without acute complication.
5. Trace cul-de-sac fluid is nonspecific. If this 53-year-old
patient is premenopausal, this is likely physiologic.

## 2021-10-03 ENCOUNTER — Encounter: Payer: Self-pay | Admitting: General Surgery

## 2021-10-20 ENCOUNTER — Encounter: Payer: Self-pay | Admitting: Urology

## 2021-10-20 ENCOUNTER — Ambulatory Visit: Payer: Medicare HMO | Admitting: Urology

## 2021-10-20 ENCOUNTER — Other Ambulatory Visit: Payer: Self-pay

## 2021-10-20 VITALS — BP 150/85 | HR 108 | Ht 66.0 in | Wt 174.0 lb

## 2021-10-20 DIAGNOSIS — N390 Urinary tract infection, site not specified: Secondary | ICD-10-CM

## 2021-10-20 LAB — BLADDER SCAN AMB NON-IMAGING

## 2021-10-20 MED ORDER — ESTRADIOL 0.1 MG/GM VA CREA: TOPICAL_CREAM | VAGINAL | 1 refills | Status: AC

## 2021-10-20 MED ORDER — CEPHALEXIN 250 MG PO CAPS: 250.0000 mg | ORAL_CAPSULE | Freq: Every day | ORAL | 0 refills | Status: DC

## 2021-10-20 NOTE — Patient Instructions (Signed)
Take cranberry tablets 1-2 times daily to help prevent infections, in addition to the topical estrogen cream around the opening of the urethra, and the low-dose antibiotics  Urinary Tract Infection, Adult A urinary tract infection (UTI) is an infection of any part of the urinary tract. The urinary tract includes the kidneys, ureters, bladder, and urethra. These organs make, store, and get rid of urine in the body. An upper UTI affects the ureters and kidneys. A lower UTI affects the bladder and urethra. What are the causes? Most urinary tract infections are caused by bacteria in your genital area around your urethra, where urine leaves your body. These bacteria grow and cause inflammation of your urinary tract. What increases the risk? You are more likely to develop this condition if: You have a urinary catheter that stays in place. You are not able to control when you urinate or have a bowel movement (incontinence). You are female and you: Use a spermicide or diaphragm for birth control. Have low estrogen levels. Are pregnant. You have certain genes that increase your risk. You are sexually active. You take antibiotic medicines. You have a condition that causes your flow of urine to slow down, such as: An enlarged prostate, if you are female. Blockage in your urethra. A kidney stone. A nerve condition that affects your bladder control (neurogenic bladder). Not getting enough to drink, or not urinating often. You have certain medical conditions, such as: Diabetes. A weak disease-fighting system (immunesystem). Sickle cell disease. Gout. Spinal cord injury. What are the signs or symptoms? Symptoms of this condition include: Needing to urinate right away (urgency). Frequent urination. This may include small amounts of urine each time you urinate. Pain or burning with urination. Blood in the urine. Urine that smells bad or unusual. Trouble urinating. Cloudy urine. Vaginal discharge,  if you are female. Pain in the abdomen or the lower back. You may also have: Vomiting or a decreased appetite. Confusion. Irritability or tiredness. A fever or chills. Diarrhea. The first symptom in older adults may be confusion. In some cases, they may not have any symptoms until the infection has worsened. How is this diagnosed? This condition is diagnosed based on your medical history and a physical exam. You may also have other tests, including: Urine tests. Blood tests. Tests for STIs (sexually transmitted infections). If you have had more than one UTI, a cystoscopy or imaging studies may be done to determine the cause of the infections. How is this treated? Treatment for this condition includes: Antibiotic medicine. Over-the-counter medicines to treat discomfort. Drinking enough water to stay hydrated. If you have frequent infections or have other conditions such as a kidney stone, you may need to see a health care provider who specializes in the urinary tract (urologist). In rare cases, urinary tract infections can cause sepsis. Sepsis is a life-threatening condition that occurs when the body responds to an infection. Sepsis is treated in the hospital with IV antibiotics, fluids, and other medicines. Follow these instructions at home: Medicines Take over-the-counter and prescription medicines only as told by your health care provider. If you were prescribed an antibiotic medicine, take it as told by your health care provider. Do not stop using the antibiotic even if you start to feel better. General instructions Make sure you: Empty your bladder often and completely. Do not hold urine for long periods of time. Empty your bladder after sex. Wipe from front to back after urinating or having a bowel movement if you are female. Use each  tissue only one time when you wipe. Drink enough fluid to keep your urine pale yellow. Keep all follow-up visits. This is important. Contact a  health care provider if: Your symptoms do not get better after 1-2 days. Your symptoms go away and then return. Get help right away if: You have severe pain in your back or your lower abdomen. You have a fever or chills. You have nausea or vomiting. Summary A urinary tract infection (UTI) is an infection of any part of the urinary tract, which includes the kidneys, ureters, bladder, and urethra. Most urinary tract infections are caused by bacteria in your genital area. Treatment for this condition often includes antibiotic medicines. If you were prescribed an antibiotic medicine, take it as told by your health care provider. Do not stop using the antibiotic even if you start to feel better. Keep all follow-up visits. This is important. This information is not intended to replace advice given to you by your health care provider. Make sure you discuss any questions you have with your health care provider. Document Revised: 07/23/2020 Document Reviewed: 07/23/2020 Elsevier Patient Education  2022 [employer].

## 2021-10-20 NOTE — Progress Notes (Signed)
10/20/21 10:48 AM   Jenness Corner Dec 02, 1966 [id number]  CC: Recurrent UTI  HPI: 55 year old female with recurrent UTIs over the last few years.  Symptoms with UTI are pelvic pressure/discomfort, and foul-smelling urine.  She has baseline urgency and frequency.  She denies any incontinence or gross hematuria.  Urine cultures from care everywhere reviewed, and positive for Klebsiella in May 2022 and E. coli in August 2022.  She gets symptom relief temporarily with antibiotic.  She had a CT in September 2020 that showed no evidence of stones or hydronephrosis.    PMH: Past Medical History:  Diagnosis Date   Anemia    H/O   Anxiety    Arthritis    Asthma    Back pain    COPD (chronic obstructive pulmonary disease) (HCC)    Depression    GERD (gastroesophageal reflux disease)    H/O PRIOR TO GASTRIC BYPASS   Headache    History of DVT (deep vein thrombosis)    Occurred in 2017   History of hiatal hernia    H/O   History of kidney stones    H/O   Hypertension    H/O -NO MEDS SINCE HAVING GASTRIC BYPASS   IBS (irritable bowel syndrome)    PTSD (post-traumatic stress disorder)    PTSD (post-traumatic stress disorder)     Surgical History: Past Surgical History:  Procedure Laterality Date   DILATION AND CURETTAGE OF UTERUS     GASTRIC ROUX-EN-Y N/A 11/23/2015   Procedure: LAPAROSCOPIC ROUX-EN-Y GASTRIC BYPASS, HIATAL HERNIA REPAIR;  Surgeon: Everette Rank, MD;  Location: [facility] ORS;  Service: General;  Laterality: N/A;   NECK SURGERY     TONSILLECTOMY       Family History: Family History  Problem Relation Age of Onset   Hypertension Mother    Thyroid disease Mother    Heart disease Mother    Heart failure Mother    Deep vein thrombosis Father    Hypertension Father    Depression Brother    Hypertension Brother    Depression Brother    Hypertension Brother     Social History:  reports that she has never smoked. She has never used smokeless tobacco. She  reports current alcohol use. She reports that she does not use drugs.  Physical Exam: BP (!) 150/85   Pulse (!) 108   Ht 5\' 6"  (1.676 m)   Wt 174 lb (78.9 kg)   LMP 03/09/2016   BMI 28.08 kg/m    Constitutional:  Alert and oriented, No acute distress. Cardiovascular: No clubbing, cyanosis, or edema. Respiratory: Normal respiratory effort, no increased work of breathing. GI: Abdomen is soft, nontender, nondistended, no abdominal masses  Laboratory Data: Reviewed, see HPI   Pertinent Imaging: I have personally viewed and interpreted the CT abdomen and pelvis from September 2020 that shows no hydronephrosis or stones.  Assessment & Plan:   55 year old female with reportedly 2 years of recurrent UTIs, positive cultures x2 in the last 6 months for Klebsiella and E. coli.  We discussed the evaluation and treatment of patients with recurrent UTIs at length.  We specifically discussed the differences between asymptomatic bacteriuria and true urinary tract infection.  We discussed the AUA definition of recurrent UTI of at least 2 culture proven symptomatic acute cystitis episodes in a 9-month period, or 3 within a 1 year period.  We discussed the importance of culture directed antibiotic treatment, and antibiotic stewardship.  First-line therapy includes nitrofurantoin(5 days), Bactrim(3 days),  or fosfomycin(3 g single dose).  Possible etiologies of recurrent infection include periurethral tissue atrophy in postmenopausal woman, constipation, sexual activity, incomplete emptying, anatomic abnormalities, and even genetic predisposition.  Finally, we discussed the role of perineal hygiene, timed voiding, adequate hydration, topical vaginal estrogen, cranberry prophylaxis, and low-dose antibiotic prophylaxis.  Topical estrogen cream, instructions provided Cranberry tablets twice daily Temporary low-dose Keflex prophylaxis x90 days RTC 6 months symptom check  Legrand Rams,  MD 10/20/2021  [facility] [address] [place], [place] [address] [phone]

## 2021-10-21 LAB — URINALYSIS, COMPLETE
Bilirubin, UA: NEGATIVE
Glucose, UA: NEGATIVE
Nitrite, UA: POSITIVE — AB
Protein,UA: NEGATIVE
RBC, UA: NEGATIVE
Specific Gravity, UA: 1.025 (ref 1.005–1.030)
Urobilinogen, Ur: 0.2 mg/dL (ref 0.2–1.0)
pH, UA: 5.5 (ref 5.0–7.5)

## 2021-10-21 LAB — MICROSCOPIC EXAMINATION

## 2022-04-26 ENCOUNTER — Ambulatory Visit: Payer: Medicare HMO | Admitting: Urology

## 2022-11-28 LAB — COLOGUARD

## 2022-12-21 LAB — COLOGUARD: COLOGUARD: NEGATIVE

## 2023-04-22 ENCOUNTER — Emergency Department (HOSPITAL_COMMUNITY): Payer: Medicare HMO

## 2023-04-22 ENCOUNTER — Encounter (HOSPITAL_COMMUNITY): Payer: Self-pay | Admitting: *Deleted

## 2023-04-22 ENCOUNTER — Inpatient Hospital Stay (HOSPITAL_COMMUNITY)
Admission: EM | Admit: 2023-04-22 | Discharge: 2023-04-25 | DRG: 488 | Disposition: A | Discharge status: Home / Self Care | Payer: Medicare HMO | Attending: Student | Admitting: Student

## 2023-04-22 ENCOUNTER — Other Ambulatory Visit: Payer: Self-pay

## 2023-04-22 DIAGNOSIS — F419 Anxiety disorder, unspecified: Secondary | ICD-10-CM | POA: Diagnosis present

## 2023-04-22 DIAGNOSIS — F32A Depression, unspecified: Secondary | ICD-10-CM | POA: Diagnosis not present

## 2023-04-22 DIAGNOSIS — Z79899 Other long term (current) drug therapy: Secondary | ICD-10-CM

## 2023-04-22 DIAGNOSIS — Z91018 Allergy to other foods: Secondary | ICD-10-CM

## 2023-04-22 DIAGNOSIS — S82202A Unspecified fracture of shaft of left tibia, initial encounter for closed fracture: Secondary | ICD-10-CM | POA: Diagnosis present

## 2023-04-22 DIAGNOSIS — Z91048 Other nonmedicinal substance allergy status: Secondary | ICD-10-CM

## 2023-04-22 DIAGNOSIS — Z87442 Personal history of urinary calculi: Secondary | ICD-10-CM

## 2023-04-22 DIAGNOSIS — Y9355 Activity, bike riding: Secondary | ICD-10-CM

## 2023-04-22 DIAGNOSIS — M62838 Other muscle spasm: Secondary | ICD-10-CM | POA: Diagnosis not present

## 2023-04-22 DIAGNOSIS — S82142A Displaced bicondylar fracture of left tibia, initial encounter for closed fracture: Secondary | ICD-10-CM | POA: Diagnosis not present

## 2023-04-22 DIAGNOSIS — K589 Irritable bowel syndrome without diarrhea: Secondary | ICD-10-CM | POA: Diagnosis not present

## 2023-04-22 DIAGNOSIS — Z9049 Acquired absence of other specified parts of digestive tract: Secondary | ICD-10-CM

## 2023-04-22 DIAGNOSIS — F4310 Post-traumatic stress disorder, unspecified: Secondary | ICD-10-CM | POA: Diagnosis not present

## 2023-04-22 DIAGNOSIS — Z818 Family history of other mental and behavioral disorders: Secondary | ICD-10-CM | POA: Diagnosis not present

## 2023-04-22 DIAGNOSIS — D62 Acute posthemorrhagic anemia: Secondary | ICD-10-CM | POA: Diagnosis not present

## 2023-04-22 DIAGNOSIS — Z91012 Allergy to eggs: Secondary | ICD-10-CM | POA: Diagnosis not present

## 2023-04-22 DIAGNOSIS — I739 Peripheral vascular disease, unspecified: Secondary | ICD-10-CM | POA: Diagnosis present

## 2023-04-22 DIAGNOSIS — S83262A Peripheral tear of lateral meniscus, current injury, left knee, initial encounter: Secondary | ICD-10-CM | POA: Diagnosis not present

## 2023-04-22 DIAGNOSIS — I10 Essential (primary) hypertension: Secondary | ICD-10-CM | POA: Diagnosis present

## 2023-04-22 DIAGNOSIS — Z9884 Bariatric surgery status: Secondary | ICD-10-CM | POA: Diagnosis not present

## 2023-04-22 DIAGNOSIS — S82252A Displaced comminuted fracture of shaft of left tibia, initial encounter for closed fracture: Principal | ICD-10-CM

## 2023-04-22 DIAGNOSIS — Z8249 Family history of ischemic heart disease and other diseases of the circulatory system: Secondary | ICD-10-CM | POA: Diagnosis not present

## 2023-04-22 DIAGNOSIS — J4489 Other specified chronic obstructive pulmonary disease: Secondary | ICD-10-CM | POA: Diagnosis not present

## 2023-04-22 DIAGNOSIS — K219 Gastro-esophageal reflux disease without esophagitis: Secondary | ICD-10-CM | POA: Diagnosis not present

## 2023-04-22 DIAGNOSIS — E739 Lactose intolerance, unspecified: Secondary | ICD-10-CM | POA: Diagnosis present

## 2023-04-22 DIAGNOSIS — Z86718 Personal history of other venous thrombosis and embolism: Secondary | ICD-10-CM | POA: Diagnosis not present

## 2023-04-22 DIAGNOSIS — Z8349 Family history of other endocrine, nutritional and metabolic diseases: Secondary | ICD-10-CM

## 2023-04-22 HISTORY — DX: Peripheral vascular disease, unspecified: I73.9

## 2023-04-22 LAB — CBC
HCT: 37.3 % (ref 36.0–46.0)
Hemoglobin: 12.4 g/dL (ref 12.0–15.0)
MCH: 32 pg (ref 26.0–34.0)
MCHC: 33.2 g/dL (ref 30.0–36.0)
MCV: 96.1 fL (ref 80.0–100.0)
Platelets: 201 10*3/uL (ref 150–400)
RBC: 3.88 MIL/uL (ref 3.87–5.11)
RDW: 13.5 % (ref 11.5–15.5)
WBC: 11.4 10*3/uL — ABNORMAL HIGH (ref 4.0–10.5)
nRBC: 0 % (ref 0.0–0.2)

## 2023-04-22 LAB — BASIC METABOLIC PANEL
Anion gap: 12 (ref 5–15)
BUN: 19 mg/dL (ref 6–20)
CO2: 22 mmol/L (ref 22–32)
Calcium: 8.3 mg/dL — ABNORMAL LOW (ref 8.9–10.3)
Chloride: 103 mmol/L (ref 98–111)
Creatinine, Ser: 0.8 mg/dL (ref 0.44–1.00)
GFR, Estimated: >60 mL/min (ref >60)
Glucose, Bld: 146 mg/dL — ABNORMAL HIGH (ref 70–99)
Potassium: 3.1 mmol/L — ABNORMAL LOW (ref 3.5–5.1)
Sodium: 137 mmol/L (ref 135–145)

## 2023-04-22 LAB — HIV ANTIBODY (ROUTINE TESTING W REFLEX): HIV Screen 4th Generation wRfx: NONREACTIVE

## 2023-04-22 MED ORDER — ACETAMINOPHEN 325 MG PO TABS: 325.0000 mg | ORAL_TABLET | Freq: Four times a day (QID) | ORAL | Status: DC | PRN

## 2023-04-22 MED ORDER — OXYCODONE HCL 5 MG PO TABS
5.0000 mg | ORAL_TABLET | ORAL | Status: DC | PRN
  Administered 2023-04-22: 10 mg via ORAL
  Filled 2023-04-22: qty 2

## 2023-04-22 MED ORDER — ESTRADIOL 0.1 MG/GM VA CREA
1.0000 | TOPICAL_CREAM | VAGINAL | Status: DC
  Filled 2023-04-22: qty 42.5

## 2023-04-22 MED ORDER — MORPHINE SULFATE (PF) 4 MG/ML IV SOLN
4.0000 mg | Freq: Once | INTRAVENOUS | Status: AC
  Administered 2023-04-22: 4 mg via INTRAVENOUS
  Filled 2023-04-22: qty 1

## 2023-04-22 MED ORDER — HYDROMORPHONE HCL 1 MG/ML IJ SOLN
0.5000 mg | INTRAMUSCULAR | Status: DC | PRN
  Administered 2023-04-22 – 2023-04-25 (×11): 1 mg via INTRAVENOUS
  Filled 2023-04-22 (×11): qty 1

## 2023-04-22 MED ORDER — METHYLPHENIDATE HCL 5 MG PO TABS
10.0000 mg | ORAL_TABLET | Freq: Every morning | ORAL | Status: DC
  Administered 2023-04-24 – 2023-04-25 (×2): 10 mg via ORAL
  Filled 2023-04-22 (×2): qty 2

## 2023-04-22 MED ORDER — OXYCODONE HCL 5 MG PO TABS
10.0000 mg | ORAL_TABLET | ORAL | Status: DC | PRN
  Administered 2023-04-23 – 2023-04-25 (×11): 15 mg via ORAL
  Filled 2023-04-22 (×11): qty 3

## 2023-04-22 MED ORDER — METHOCARBAMOL 500 MG PO TABS
500.0000 mg | ORAL_TABLET | Freq: Four times a day (QID) | ORAL | Status: DC | PRN
  Administered 2023-04-23 – 2023-04-24 (×2): 500 mg via ORAL
  Filled 2023-04-22 (×2): qty 1

## 2023-04-22 MED ORDER — FLEET ENEMA 7-19 GM/118ML RE ENEM: 1.0000 | ENEMA | Freq: Once | RECTAL | Status: DC | PRN

## 2023-04-22 MED ORDER — FUROSEMIDE 40 MG PO TABS
40.0000 mg | ORAL_TABLET | Freq: Every day | ORAL | Status: DC
  Administered 2023-04-24 – 2023-04-25 (×2): 40 mg via ORAL
  Filled 2023-04-22 (×2): qty 1

## 2023-04-22 MED ORDER — METHOCARBAMOL 1000 MG/10ML IJ SOLN: 500.0000 mg | Freq: Four times a day (QID) | INTRAVENOUS | Status: DC | PRN

## 2023-04-22 MED ORDER — SENNOSIDES-DOCUSATE SODIUM 8.6-50 MG PO TABS: 1.0000 | ORAL_TABLET | Freq: Every evening | ORAL | Status: DC | PRN

## 2023-04-22 MED ORDER — SPIRONOLACTONE 25 MG PO TABS
25.0000 mg | ORAL_TABLET | Freq: Every day | ORAL | Status: DC
  Administered 2023-04-24 – 2023-04-25 (×2): 25 mg via ORAL
  Filled 2023-04-22 (×2): qty 1

## 2023-04-22 MED ORDER — SENNA 8.6 MG PO TABS
1.0000 | ORAL_TABLET | Freq: Two times a day (BID) | ORAL | Status: DC
  Administered 2023-04-23 – 2023-04-25 (×4): 8.6 mg via ORAL
  Filled 2023-04-22 (×4): qty 1

## 2023-04-22 MED ORDER — LACTATED RINGERS IV SOLN: INTRAVENOUS | Status: DC

## 2023-04-22 MED ORDER — FENTANYL CITRATE PF 50 MCG/ML IJ SOSY
50.0000 ug | PREFILLED_SYRINGE | Freq: Once | INTRAMUSCULAR | Status: AC
  Administered 2023-04-22: 50 ug via INTRAMUSCULAR
  Filled 2023-04-22: qty 1

## 2023-04-22 MED ORDER — ALPRAZOLAM 0.25 MG PO TABS
0.2500 mg | ORAL_TABLET | Freq: Every evening | ORAL | Status: DC | PRN
  Administered 2023-04-22: 0.25 mg via ORAL
  Filled 2023-04-22: qty 1

## 2023-04-22 MED ORDER — CELECOXIB 200 MG PO CAPS
200.0000 mg | ORAL_CAPSULE | Freq: Two times a day (BID) | ORAL | Status: DC
  Administered 2023-04-22 – 2023-04-25 (×5): 200 mg via ORAL
  Filled 2023-04-22 (×5): qty 1

## 2023-04-22 MED ORDER — ONDANSETRON HCL 4 MG/2ML IJ SOLN
4.0000 mg | Freq: Four times a day (QID) | INTRAMUSCULAR | Status: DC | PRN
  Administered 2023-04-23: 4 mg via INTRAVENOUS
  Filled 2023-04-22: qty 2

## 2023-04-22 MED ORDER — DULOXETINE HCL 60 MG PO CPEP
120.0000 mg | ORAL_CAPSULE | Freq: Every morning | ORAL | Status: DC
  Administered 2023-04-23 – 2023-04-25 (×3): 120 mg via ORAL
  Filled 2023-04-22: qty 4
  Filled 2023-04-22 (×2): qty 2

## 2023-04-22 MED ORDER — BISACODYL 5 MG PO TBEC: 5.0000 mg | DELAYED_RELEASE_TABLET | Freq: Every day | ORAL | Status: DC | PRN

## 2023-04-22 MED ORDER — FENTANYL CITRATE PF 50 MCG/ML IJ SOSY
50.0000 ug | PREFILLED_SYRINGE | Freq: Once | INTRAMUSCULAR | Status: AC
  Administered 2023-04-22: 50 ug via INTRAVENOUS
  Filled 2023-04-22: qty 1

## 2023-04-22 MED ORDER — TRAZODONE HCL 50 MG PO TABS
200.0000 mg | ORAL_TABLET | Freq: Every evening | ORAL | Status: DC | PRN
  Administered 2023-04-22 – 2023-04-24 (×3): 200 mg via ORAL
  Filled 2023-04-22 (×3): qty 4

## 2023-04-22 MED ORDER — ONDANSETRON HCL 4 MG PO TABS: 4.0000 mg | ORAL_TABLET | Freq: Four times a day (QID) | ORAL | Status: DC | PRN

## 2023-04-22 MED ORDER — DOCUSATE SODIUM 100 MG PO CAPS
100.0000 mg | ORAL_CAPSULE | Freq: Two times a day (BID) | ORAL | Status: DC
  Administered 2023-04-23 – 2023-04-25 (×4): 100 mg via ORAL
  Filled 2023-04-22 (×4): qty 1

## 2023-04-22 MED ORDER — ACETAMINOPHEN 500 MG PO TABS
1000.0000 mg | ORAL_TABLET | Freq: Four times a day (QID) | ORAL | Status: DC
  Administered 2023-04-22 – 2023-04-23 (×2): 1000 mg via ORAL
  Filled 2023-04-22 (×2): qty 2

## 2023-04-22 NOTE — ED Notes (Signed)
[facility] called for transport to  [place] at this time. 
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
## 2023-04-23 ENCOUNTER — Encounter (HOSPITAL_COMMUNITY)
Admission: EM | Disposition: A | Discharge status: Home / Self Care | Payer: Self-pay | Source: Home / Self Care | Attending: Student

## 2023-04-23 ENCOUNTER — Observation Stay (HOSPITAL_COMMUNITY): Payer: Medicare HMO

## 2023-04-23 ENCOUNTER — Other Ambulatory Visit: Payer: Self-pay

## 2023-04-23 ENCOUNTER — Encounter (HOSPITAL_COMMUNITY): Payer: Self-pay | Admitting: Orthopedic Surgery

## 2023-04-23 ENCOUNTER — Observation Stay (HOSPITAL_BASED_OUTPATIENT_CLINIC_OR_DEPARTMENT_OTHER): Payer: Medicare HMO | Admitting: Certified Registered"

## 2023-04-23 ENCOUNTER — Observation Stay (HOSPITAL_COMMUNITY): Payer: Medicare HMO | Admitting: Certified Registered"

## 2023-04-23 DIAGNOSIS — J45909 Unspecified asthma, uncomplicated: Secondary | ICD-10-CM | POA: Diagnosis not present

## 2023-04-23 DIAGNOSIS — S82142A Displaced bicondylar fracture of left tibia, initial encounter for closed fracture: Secondary | ICD-10-CM | POA: Diagnosis not present

## 2023-04-23 DIAGNOSIS — S83282A Other tear of lateral meniscus, current injury, left knee, initial encounter: Secondary | ICD-10-CM

## 2023-04-23 DIAGNOSIS — I10 Essential (primary) hypertension: Secondary | ICD-10-CM

## 2023-04-23 DIAGNOSIS — D649 Anemia, unspecified: Secondary | ICD-10-CM

## 2023-04-23 HISTORY — PX: MENISCUS REPAIR: SHX5179

## 2023-04-23 HISTORY — PX: ORIF TIBIA PLATEAU: SHX2132

## 2023-04-23 LAB — CBC
HCT: 37.3 % (ref 36.0–46.0)
Hemoglobin: 12.9 g/dL (ref 12.0–15.0)
MCH: 33.2 pg (ref 26.0–34.0)
MCHC: 34.6 g/dL (ref 30.0–36.0)
MCV: 96.1 fL (ref 80.0–100.0)
Platelets: 177 10*3/uL (ref 150–400)
RBC: 3.88 MIL/uL (ref 3.87–5.11)
RDW: 13.8 % (ref 11.5–15.5)
WBC: 7.4 10*3/uL (ref 4.0–10.5)
nRBC: 0 % (ref 0.0–0.2)

## 2023-04-23 LAB — CREATININE, SERUM
Creatinine, Ser: 0.76 mg/dL (ref 0.44–1.00)
GFR, Estimated: >60 mL/min (ref >60)

## 2023-04-23 LAB — SURGICAL PCR SCREEN
MRSA, PCR: NEGATIVE
Staphylococcus aureus: NEGATIVE

## 2023-04-23 SURGERY — OPEN REDUCTION INTERNAL FIXATION (ORIF) TIBIAL PLATEAU
Anesthesia: General | Site: Leg Lower | Laterality: Left

## 2023-04-23 MED ORDER — POLYETHYLENE GLYCOL 3350 17 G PO PACK: 17.0000 g | PACK | Freq: Every day | ORAL | Status: DC | PRN

## 2023-04-23 MED ORDER — BACITRACIN ZINC 500 UNIT/GM EX OINT
TOPICAL_OINTMENT | CUTANEOUS | Status: AC
  Filled 2023-04-23: qty 28.35

## 2023-04-23 MED ORDER — LIDOCAINE 2% (20 MG/ML) 5 ML SYRINGE
INTRAMUSCULAR | Status: DC | PRN
  Administered 2023-04-23: 60 mg via INTRAVENOUS

## 2023-04-23 MED ORDER — ONDANSETRON HCL 4 MG/2ML IJ SOLN
INTRAMUSCULAR | Status: AC
  Filled 2023-04-23: qty 2

## 2023-04-23 MED ORDER — ORAL CARE MOUTH RINSE: 15.0000 mL | Freq: Once | OROMUCOSAL | Status: AC

## 2023-04-23 MED ORDER — PROPOFOL 10 MG/ML IV BOLUS
INTRAVENOUS | Status: DC | PRN
  Administered 2023-04-23: 130 mg via INTRAVENOUS

## 2023-04-23 MED ORDER — METOCLOPRAMIDE HCL 5 MG/ML IJ SOLN: 5.0000 mg | Freq: Three times a day (TID) | INTRAMUSCULAR | Status: DC | PRN

## 2023-04-23 MED ORDER — ACETAMINOPHEN 500 MG PO TABS
1000.0000 mg | ORAL_TABLET | Freq: Four times a day (QID) | ORAL | Status: DC
  Administered 2023-04-23 – 2023-04-25 (×6): 1000 mg via ORAL
  Filled 2023-04-23 (×7): qty 2

## 2023-04-23 MED ORDER — LACTATED RINGERS IV SOLN: INTRAVENOUS | Status: DC

## 2023-04-23 MED ORDER — FENTANYL CITRATE (PF) 250 MCG/5ML IJ SOLN
INTRAMUSCULAR | Status: AC
  Filled 2023-04-23: qty 5

## 2023-04-23 MED ORDER — ONDANSETRON HCL 4 MG PO TABS: 4.0000 mg | ORAL_TABLET | Freq: Four times a day (QID) | ORAL | Status: DC | PRN

## 2023-04-23 MED ORDER — DEXAMETHASONE SODIUM PHOSPHATE 10 MG/ML IJ SOLN
INTRAMUSCULAR | Status: DC | PRN
  Administered 2023-04-23: 10 mg via INTRAVENOUS

## 2023-04-23 MED ORDER — DEXAMETHASONE SODIUM PHOSPHATE 10 MG/ML IJ SOLN
INTRAMUSCULAR | Status: AC
  Filled 2023-04-23: qty 1

## 2023-04-23 MED ORDER — CEFAZOLIN SODIUM-DEXTROSE 2-4 GM/100ML-% IV SOLN
2.0000 g | Freq: Three times a day (TID) | INTRAVENOUS | Status: AC
  Administered 2023-04-23 – 2023-04-24 (×3): 2 g via INTRAVENOUS
  Filled 2023-04-23 (×3): qty 100

## 2023-04-23 MED ORDER — DIPHENHYDRAMINE HCL 12.5 MG/5ML PO ELIX
12.5000 mg | ORAL_SOLUTION | ORAL | Status: DC | PRN
  Administered 2023-04-25: 25 mg via ORAL
  Filled 2023-04-23: qty 10

## 2023-04-23 MED ORDER — CHLORHEXIDINE GLUCONATE 4 % EX SOLN
60.0000 mL | Freq: Once | CUTANEOUS | Status: AC
  Administered 2023-04-23: 4 via TOPICAL

## 2023-04-23 MED ORDER — FENTANYL CITRATE (PF) 250 MCG/5ML IJ SOLN
INTRAMUSCULAR | Status: DC | PRN
  Administered 2023-04-23: 100 ug via INTRAVENOUS
  Administered 2023-04-23: 50 ug via INTRAVENOUS

## 2023-04-23 MED ORDER — SODIUM CHLORIDE 0.9 % IV SOLN: INTRAVENOUS | Status: DC

## 2023-04-23 MED ORDER — VANCOMYCIN HCL 1000 MG IV SOLR
INTRAVENOUS | Status: DC | PRN
  Administered 2023-04-23: 1000 mg via TOPICAL

## 2023-04-23 MED ORDER — MIDAZOLAM HCL 5 MG/5ML IJ SOLN
INTRAMUSCULAR | Status: DC | PRN
  Administered 2023-04-23: 2 mg via INTRAVENOUS

## 2023-04-23 MED ORDER — ENOXAPARIN SODIUM 40 MG/0.4ML IJ SOSY
40.0000 mg | PREFILLED_SYRINGE | INTRAMUSCULAR | Status: DC
  Administered 2023-04-24 – 2023-04-25 (×2): 40 mg via SUBCUTANEOUS
  Filled 2023-04-23 (×2): qty 0.4

## 2023-04-23 MED ORDER — CEFAZOLIN SODIUM-DEXTROSE 2-4 GM/100ML-% IV SOLN
2.0000 g | INTRAVENOUS | Status: AC
  Administered 2023-04-23: 2 g via INTRAVENOUS
  Filled 2023-04-23: qty 100

## 2023-04-23 MED ORDER — SUGAMMADEX SODIUM 200 MG/2ML IV SOLN
INTRAVENOUS | Status: DC | PRN
  Administered 2023-04-23: 200 mg via INTRAVENOUS

## 2023-04-23 MED ORDER — MIDAZOLAM HCL 2 MG/2ML IJ SOLN
INTRAMUSCULAR | Status: AC
  Filled 2023-04-23: qty 2

## 2023-04-23 MED ORDER — TRANEXAMIC ACID-NACL 1000-0.7 MG/100ML-% IV SOLN
1000.0000 mg | INTRAVENOUS | Status: AC
  Administered 2023-04-23: 1000 mg via INTRAVENOUS
  Filled 2023-04-23: qty 100

## 2023-04-23 MED ORDER — ROCURONIUM BROMIDE 10 MG/ML (PF) SYRINGE
PREFILLED_SYRINGE | INTRAVENOUS | Status: DC | PRN
  Administered 2023-04-23: 60 mg via INTRAVENOUS

## 2023-04-23 MED ORDER — CHLORHEXIDINE GLUCONATE 0.12 % MT SOLN: 15.0000 mL | Freq: Once | OROMUCOSAL | Status: AC

## 2023-04-23 MED ORDER — METOCLOPRAMIDE HCL 5 MG PO TABS: 5.0000 mg | ORAL_TABLET | Freq: Three times a day (TID) | ORAL | Status: DC | PRN

## 2023-04-23 MED ORDER — VANCOMYCIN HCL 1000 MG IV SOLR
INTRAVENOUS | Status: AC
  Filled 2023-04-23: qty 20

## 2023-04-23 MED ORDER — ONDANSETRON HCL 4 MG/2ML IJ SOLN: 4.0000 mg | Freq: Four times a day (QID) | INTRAMUSCULAR | Status: DC | PRN

## 2023-04-23 MED ORDER — CHLORHEXIDINE GLUCONATE 0.12 % MT SOLN
OROMUCOSAL | Status: AC
  Administered 2023-04-23: 15 mL via OROMUCOSAL
  Filled 2023-04-23: qty 15

## 2023-04-23 MED ORDER — 0.9 % SODIUM CHLORIDE (POUR BTL) OPTIME
TOPICAL | Status: DC | PRN
  Administered 2023-04-23: 1000 mL

## 2023-04-23 MED ORDER — HYDROMORPHONE HCL 1 MG/ML IJ SOLN
INTRAMUSCULAR | Status: AC
  Filled 2023-04-23: qty 1

## 2023-04-23 MED ORDER — POVIDONE-IODINE 10 % EX SWAB
2.0000 | Freq: Once | CUTANEOUS | Status: AC
  Administered 2023-04-23: 2 via TOPICAL

## 2023-04-23 MED ORDER — HYDROMORPHONE HCL 1 MG/ML IJ SOLN
0.2500 mg | INTRAMUSCULAR | Status: DC | PRN
  Administered 2023-04-23 (×3): 0.25 mg via INTRAVENOUS

## 2023-04-23 MED ORDER — ONDANSETRON HCL 4 MG/2ML IJ SOLN
INTRAMUSCULAR | Status: DC | PRN
  Administered 2023-04-23: 4 mg via INTRAVENOUS

## 2023-04-23 SURGICAL SUPPLY — 73 items
BAG COUNTER SPONGE SURGICOUNT (BAG) ×2 IMPLANT
BANDAGE ESMARK 6X9 LF (GAUZE/BANDAGES/DRESSINGS) ×2 IMPLANT
BENZOIN TINCTURE PRP APPL 2/3 (GAUZE/BANDAGES/DRESSINGS) IMPLANT
BIT DRILL QC SFS 2.5X170 (BIT) IMPLANT
BIT DRILL QC SFS 2.8X200 (BIT) IMPLANT
BLADE CLIPPER SURG (BLADE) IMPLANT
BLADE SURG 15 STRL LF DISP TIS (BLADE) ×2 IMPLANT
BLADE SURG 15 STRL SS (BLADE) ×2
BNDG ELASTIC 4INX 5YD STR LF (GAUZE/BANDAGES/DRESSINGS) IMPLANT
BNDG ELASTIC 4X5.8 VLCR STR LF (GAUZE/BANDAGES/DRESSINGS) ×2 IMPLANT
BNDG ELASTIC 6X10 VLCR STRL LF (GAUZE/BANDAGES/DRESSINGS) IMPLANT
BNDG ELASTIC 6X5.8 VLCR STR LF (GAUZE/BANDAGES/DRESSINGS) ×2 IMPLANT
BNDG ESMARK 6X9 LF (GAUZE/BANDAGES/DRESSINGS) ×2
BNDG GAUZE DERMACEA FLUFF 4 (GAUZE/BANDAGES/DRESSINGS) ×2 IMPLANT
BRUSH SCRUB EZ PLAIN DRY (MISCELLANEOUS) ×4 IMPLANT
CANISTER SUCT 3000ML PPV (MISCELLANEOUS) ×2 IMPLANT
CHLORAPREP W/TINT 26 (MISCELLANEOUS) ×4 IMPLANT
COVER SURGICAL LIGHT HANDLE (MISCELLANEOUS) ×2 IMPLANT
CUFF TOURN SGL QUICK 34 (TOURNIQUET CUFF) ×2
CUFF TRNQT CYL 34X4.125X (TOURNIQUET CUFF) ×2 IMPLANT
DRAPE C-ARM 42X72 X-RAY (DRAPES) ×2 IMPLANT
DRAPE C-ARMOR (DRAPES) ×2 IMPLANT
DRAPE ORTHO SPLIT 77X108 STRL (DRAPES) ×4
DRAPE SURG ORHT 6 SPLT 77X108 (DRAPES) ×4 IMPLANT
DRAPE U-SHAPE 47X51 STRL (DRAPES) ×2 IMPLANT
ELECT REM PT RETURN 9FT ADLT (ELECTROSURGICAL) ×2
ELECTRODE REM PT RTRN 9FT ADLT (ELECTROSURGICAL) ×2 IMPLANT
GAUZE PAD ABD 8X10 STRL (GAUZE/BANDAGES/DRESSINGS) ×4 IMPLANT
GAUZE SPONGE 4X4 12PLY STRL (GAUZE/BANDAGES/DRESSINGS) ×2 IMPLANT
GLOVE BIO SURGEON STRL SZ 6.5 (GLOVE) ×6 IMPLANT
GLOVE BIO SURGEON STRL SZ7.5 (GLOVE) ×8 IMPLANT
GLOVE BIOGEL PI IND STRL 6.5 (GLOVE) ×2 IMPLANT
GLOVE BIOGEL PI IND STRL 7.5 (GLOVE) ×2 IMPLANT
GOWN STRL REUS W/ TWL LRG LVL3 (GOWN DISPOSABLE) ×4 IMPLANT
GOWN STRL REUS W/TWL LRG LVL3 (GOWN DISPOSABLE) ×4
IMMOBILIZER KNEE 22 UNIV (SOFTGOODS) ×2 IMPLANT
KIT BASIN OR (CUSTOM PROCEDURE TRAY) ×2 IMPLANT
KIT TURNOVER KIT B (KITS) ×2 IMPLANT
NDL SUT 6 .5 CRC .975X.05 MAYO (NEEDLE) ×2 IMPLANT
NEEDLE MAYO TAPER (NEEDLE) ×2
NS IRRIG 1000ML POUR BTL (IV SOLUTION) ×2 IMPLANT
PACK TOTAL JOINT (CUSTOM PROCEDURE TRAY) ×2 IMPLANT
PAD ARMBOARD 7.5X6 YLW CONV (MISCELLANEOUS) ×4 IMPLANT
PAD CAST 4YDX4 CTTN HI CHSV (CAST SUPPLIES) ×2 IMPLANT
PADDING CAST COTTON 4X4 STRL (CAST SUPPLIES) ×2
PADDING CAST COTTON 6X4 STRL (CAST SUPPLIES) ×2 IMPLANT
PLATE PROX TIBIA 6H LT 3.5 VA (Plate) IMPLANT
SCREW CORT 3.5X42M SELF TAP (Screw) IMPLANT
SCREW CORTEX 3.5X80MM (Screw) IMPLANT
SCREW LOCK CORT ST 3.5X32 (Screw) IMPLANT
SCREW LOCK CORT ST 3.5X34 (Screw) IMPLANT
SCREW LOCKING 3.5X80MM VA (Screw) IMPLANT
SCREW LOCKING VA 3.5X75MM (Screw) IMPLANT
STAPLER VISISTAT 35W (STAPLE) ×2 IMPLANT
STRIP CLOSURE SKIN 1/2X4 (GAUZE/BANDAGES/DRESSINGS) IMPLANT
SUCTION FRAZIER HANDLE 10FR (MISCELLANEOUS) ×2
SUCTION TUBE FRAZIER 10FR DISP (MISCELLANEOUS) ×2 IMPLANT
SUT ETHILON 2 0 FS 18 (SUTURE) ×2 IMPLANT
SUT ETHILON 3 0 PS 1 (SUTURE) IMPLANT
SUT FIBERWIRE #2 38 T-5 BLUE (SUTURE) ×2
SUT MNCRL AB 3-0 PS2 18 (SUTURE) IMPLANT
SUT VIC AB 0 CT1 27 (SUTURE) ×2
SUT VIC AB 0 CT1 27XBRD ANBCTR (SUTURE) IMPLANT
SUT VIC AB 1 CT1 18XCR BRD 8 (SUTURE) IMPLANT
SUT VIC AB 1 CT1 27 (SUTURE) ×2
SUT VIC AB 1 CT1 27XBRD ANBCTR (SUTURE) ×2 IMPLANT
SUT VIC AB 1 CT1 8-18 (SUTURE)
SUT VIC AB 2-0 CT1 27 (SUTURE) ×4
SUT VIC AB 2-0 CT1 TAPERPNT 27 (SUTURE) ×4 IMPLANT
SUTURE FIBERWR #2 38 T-5 BLUE (SUTURE) IMPLANT
TOWEL GREEN STERILE (TOWEL DISPOSABLE) ×4 IMPLANT
TRAY FOLEY MTR SLVR 16FR STAT (SET/KITS/TRAYS/PACK) IMPLANT
WATER STERILE IRR 1000ML POUR (IV SOLUTION) ×4 IMPLANT

## 2023-04-23 NOTE — Anesthesia Procedure Notes (Addendum)
Procedure Name: Intubation Date/Time: 04/23/2023 3:49 PM  Performed by: Lucinda Dell, CRNAPre-anesthesia Checklist: Patient identified, Emergency Drugs available, Suction available and Patient being monitored Patient Re-evaluated:Patient Re-evaluated prior to induction Oxygen Delivery Method: Circle system utilized Preoxygenation: Pre-oxygenation with 100% oxygen Induction Type: IV induction Ventilation: Mask ventilation without difficulty Laryngoscope Size: Mac and 3 Grade View: Grade I Tube type: Oral Tube size: 7.0 mm Number of attempts: 1 Airway Equipment and Method: Stylet Placement Confirmation: ETT inserted through vocal cords under direct vision, positive ETCO2 and breath sounds checked- equal and bilateral Secured at: 22 cm Tube secured with: Tape Dental Injury: Teeth and Oropharynx as per pre-operative assessment
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
## 2023-04-23 NOTE — Plan of Care (Signed)
  Problem: Education: Goal: Knowledge of General Education information will improve Description: Including pain rating scale, medication(s)/side effects and non-pharmacologic comfort measures Outcome: Progressing   Problem: Nutrition: Goal: Adequate nutrition will be maintained Outcome: Progressing   Problem: Coping: Goal: Level of anxiety will decrease Outcome: Progressing   Problem: Elimination: Goal: Will not experience complications related to bowel motility Outcome: Progressing Goal: Will not experience complications related to urinary retention Outcome: Progressing   Problem: Pain Managment: Goal: General experience of comfort will improve Outcome: Progressing   Problem: Skin Integrity: Goal: Risk for impaired skin integrity will decrease Outcome: Progressing   Problem: Safety: Goal: Ability to remain free from injury will improve Outcome: Progressing   

## 2023-04-23 NOTE — Transfer of Care (Signed)
Immediate Anesthesia Transfer of Care Note  Patient: Melinda Harrison  Procedure(s) Performed: OPEN REDUCTION INTERNAL FIXATION (ORIF) TIBIAL PLATEAU (Left: Leg Lower) REPAIR OF MENISCUS (Left: Knee)  Patient Location: PACU  Anesthesia Type:General  Level of Consciousness: awake, alert , oriented, and patient cooperative  Airway & Oxygen Therapy: Patient Spontanous Breathing and Patient connected to face mask oxygen  Post-op Assessment: Report given to RN and Post -op Vital signs reviewed and stable  Post vital signs: Reviewed and stable  Last Vitals:  Vitals Value Taken Time  BP 134/72 04/23/23 1708  Temp 36.2 C 04/23/23 1708  Pulse 87 04/23/23 1712  Resp 16 04/23/23 1712  SpO2 99 % 04/23/23 1712  Vitals shown include unvalidated device data.  Last Pain:  Vitals:   04/23/23 1433  TempSrc: Oral  PainSc: 3       Patients Stated Pain Goal: 3 (04/23/23 0049)  Complications: No notable events documented.

## 2023-04-23 NOTE — Op Note (Signed)
Orthopaedic Surgery Operative Note (CSN: [id number] ) Date of Surgery: 04/23/2023  Admit Date: 04/22/2023   Diagnoses: Pre-Op Diagnoses: Left lateral tibial plateau fracture  Post-Op Diagnosis: Left lateral tibial plateau fracture Left lateral meniscus tear  Procedures: CPT 27535-Open reduction internal fixation of left lateral tibial plateau fracture CPT 27403-Left lateral meniscus repair  Surgeons : Primary: Roby Lofts, MD  Assistant: Ulyses Southward, PA-C  Location: OR 11   Anesthesia: General  Antibiotics: Ancef 2g preop with 1 gm vancomycin powder placed topically   Tourniquet time: Total Tourniquet Time Documented: Thigh (Left) - 45 minutes Total: Thigh (Left) - 45 minutes  Estimated Blood Loss: Minimal  Complications:None   Specimens:None   Implants: Implant Name Type Inv. Item Serial No. Manufacturer Lot No. LRB No. Used Action  PLATE PROX TIBIA 6H LT 3.5 VA - [id number] Plate PLATE PROX TIBIA 6H LT 3.5 VA  [employer]  Left 1 Implanted  SCREW LOCKING VA 3.5X75MM - [id number] Screw SCREW LOCKING VA 3.5X75MM  [employer]  Left 1 Implanted  SCREW LOCKING 3.5X80MM VA - [id number] Screw SCREW LOCKING 3.5X80MM VA  [employer]  Left 2 Implanted  SCREW LOCK CORT ST 3.5X32 - [id number] Screw SCREW LOCK CORT ST 3.5X32  [employer]  Left 1 Implanted  SCREW LOCK CORT ST 3.5X34 - [id number] Screw SCREW LOCK CORT ST 3.5X34  [employer]  Left 1 Implanted  SCREW CORT 3.5X42M SELF TAP - [id number] Screw SCREW CORT 3.5X42M SELF TAP  [employer]  Left 1 Implanted  SCREW CORTEX 3.5X80MM - [id number] Screw SCREW CORTEX 3.5X80MM  [employer]  Left 1 Implanted     Indications for Surgery: 57 year old female who sustained a left lateral tibial plateau fracture.  Due to the unstable nature of her injury and depression of her articular surface I recommend proceeding with open reduction internal fixation.  Risk and benefits were  discussed with the patient and her husband.  Risks included but not limited to bleeding, infection, malunion, nonunion, hardware failure, hardware irritation, nerve or blood vessel injury, DVT, even the possibility anesthetic complications.  She agreed to proceed with surgery and consent was obtained.  Operative Findings: 1.  Open reduction internal fixation of left lateral tibial plateau fracture using [employer] VA 3.5 mm proximal tibial locking plate. 2.  Peripheral lateral meniscus tear treated with repair using #2 FiberWire.  Procedure: The patient was identified in the preoperative holding area. Consent was confirmed with the patient and their family and all questions were answered. The operative extremity was marked after confirmation with the patient. she was then brought back to the operating room by our anesthesia colleagues.  She was placed under general anesthetic and carefully transferred over to radiolucent flattop table.  A bump was placed under her operative hip.  A nonsterile tourniquet was placed to her upper thigh.  The left lower extremity was then prepped and draped in usual sterile fashion.  A timeout was performed to verify the patient, the procedure, and the extremity.  Preoperative antibiotics were dosed.  Fluoroscopic imaging was obtained to show the unstable nature of her injury.  The left hip and knee were flexed over a triangle.  The tourniquet was then inflated to 250 mmHg.  Total tourniquet time as noted above.  A lateral approach to the proximal tibia was carried down through skin and subcutaneous tissue.  I incised through the IT band and developed the plane between the capsule and the IT band.  I then released the IT band off  the lateral condyle of the proximal tibia.  I released it all the way back until I could palpate the fibular head.  I then performed a submeniscal arthrotomy accessing the articular surface.  I did visualize a small peripheral lateral meniscus tear I  would repair.  I first used 0 Vicryl sutures to tagged the capsule for later repair.  I then used a horizontal mattress fashion through the capsule and through the meniscus to repair the tear.  A total of 2 sutures were placed in the meniscus.  Once I had the meniscus managed I then turned my attention to the articular surface.  I made a small window through the lateral cortex using a 2.5 mm drill bit and osteotome and used a footed tamp to elevate the articular surface back to an anatomic position.  I then held provisionally with 1.6 mm K wires from lateral to medial.  I confirmed anatomic reduction using fluoroscopy.  I then positioned a 3.5 mm VA proximal tibial locking plate from [employer]ynthe[employer] along the lateral cortex of the tibia and slid it submuscularly along the lateral aspect of the tibial shaft.  I placed a nonlocking screw proximally to bring the plate flush to bone.  I then percutaneously placed 3.5 millimeter screws into the tibial shaft to bring the distal portion of the plate flush to bone.  I returned to the proximal aspect and placed a 3.5 mm locking screws to raft the disimpacted articular surface.  The K wires were removed final fluoroscopic imaging was obtained.  The incisions were copiously irrigated.  The tag sutures for the capsule were brought down through the plate with a free needle and tied down.  I then tied down the meniscus repair sutures to the capsule.  A gram of vancomycin powder was then placed into the incision.  A layered closure of 0 Vicryl, 2-0 Vicryl and 3-0 Monocryl with Steri-Strips were used to close the skin.  Sterile dressings were applied.  The patient was then awoken from anesthesia and taken to the PACU in stable condition.  Post Op Plan/Instructions: Patient will be nonweightbearing to the left lower extremity.  She will receive postoperative Ancef.  She will receive Lovenox for DVT prophylaxis and discharged on aspirin 325 mg.  We will have her mobilize with physical  and Occupational Therapy.  She has unrestricted range of motion of the knee.  No immobilization is needed.  I was present and performed the entire surgery.  Ulyses Southward, PA-C did assist me throughout the case. An assistant was necessary given the difficulty in approach, maintenance of reduction and ability to instrument the fracture.   Truitt Merle, MD [facility]

## 2023-04-23 NOTE — Interval H&P Note (Signed)
History and Physical Interval Note:  04/23/2023 2:58 PM  Melinda Harrison  has presented today for surgery, with the diagnosis of Left tibial plateau fracture.  The various methods of treatment have been discussed with the patient and family. After consideration of risks, benefits and other options for treatment, the patient has consented to  Procedure(s): OPEN REDUCTION INTERNAL FIXATION (ORIF) TIBIAL PLATEAU (Left) as a surgical intervention.  [name] history has been reviewed, patient examined, no change in status, stable for surgery.  I have reviewed the patient's chart and labs.  Questions were answered to the patient's satisfaction.     Caryn Bee P Jerika Wales

## 2023-04-23 NOTE — Anesthesia Preprocedure Evaluation (Addendum)
Anesthesia Evaluation  Patient identified by MRN, date of birth, ID band Patient awake    Reviewed: Allergy & Precautions, H&P , NPO status , Patient's Chart, lab work & pertinent test results  Airway Mallampati: II  TM Distance: >3 FB Neck ROM: Full    Dental no notable dental hx. (+) Teeth Intact   Pulmonary asthma    Pulmonary exam normal breath sounds clear to auscultation       Cardiovascular hypertension, + DVT   Rhythm:Regular Rate:Normal     Neuro/Psych  Headaches  Anxiety Depression       GI/Hepatic Neg liver ROS, hiatal hernia,GERD  Medicated,,  Endo/Other  negative endocrine ROS    Renal/GU negative Renal ROS  negative genitourinary   Musculoskeletal  (+) Arthritis , Osteoarthritis,    Abdominal   Peds  Hematology  (+) Blood dyscrasia, anemia   Anesthesia Other Findings   Reproductive/Obstetrics negative OB ROS                             Anesthesia Physical Anesthesia Plan  ASA: 2  Anesthesia Plan: General   Post-op Pain Management: Tylenol PO (pre-op)*   Induction: Intravenous  PONV Risk Score and Plan: 4 or greater and Ondansetron, Dexamethasone and Midazolam  Airway Management Planned: Oral ETT  Additional Equipment:   Intra-op Plan:   Post-operative Plan: Extubation in OR  Informed Consent: I have reviewed the patients History and Physical, chart, labs and discussed the procedure including the risks, benefits and alternatives for the proposed anesthesia with the patient or authorized representative who has indicated his/her understanding and acceptance.     Dental advisory given  Plan Discussed with: CRNA  Anesthesia Plan Comments:        Anesthesia Quick Evaluation

## 2023-04-23 NOTE — Anesthesia Postprocedure Evaluation (Signed)
Anesthesia Post Note  Patient: Melinda Harrison  Procedure(s) Performed: OPEN REDUCTION INTERNAL FIXATION (ORIF) TIBIAL PLATEAU (Left: Leg Lower) REPAIR OF MENISCUS (Left: Knee)     Patient location during evaluation: PACU Anesthesia Type: General Level of consciousness: awake and alert Pain management: pain level controlled Vital Signs Assessment: post-procedure vital signs reviewed and stable Respiratory status: spontaneous breathing, nonlabored ventilation, respiratory function stable and patient connected to nasal cannula oxygen Cardiovascular status: blood pressure returned to baseline and stable Postop Assessment: no apparent nausea or vomiting Anesthetic complications: no  No notable events documented.  Last Vitals:  Vitals:   04/23/23 1715 04/23/23 1730  BP: 135/76 (!) 149/77  Pulse: 83 78  Resp: 15 12  Temp:    SpO2: 97% 94%    Last Pain:  Vitals:   04/23/23 1730  TempSrc:   PainSc: 5                  Rilee Knoll,W. EDMOND
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
## 2023-04-24 ENCOUNTER — Encounter (HOSPITAL_COMMUNITY): Payer: Self-pay | Admitting: Student

## 2023-04-24 DIAGNOSIS — S83262A Peripheral tear of lateral meniscus, current injury, left knee, initial encounter: Secondary | ICD-10-CM | POA: Diagnosis present

## 2023-04-24 DIAGNOSIS — F419 Anxiety disorder, unspecified: Secondary | ICD-10-CM | POA: Diagnosis present

## 2023-04-24 DIAGNOSIS — M62838 Other muscle spasm: Secondary | ICD-10-CM | POA: Diagnosis not present

## 2023-04-24 DIAGNOSIS — E739 Lactose intolerance, unspecified: Secondary | ICD-10-CM | POA: Diagnosis present

## 2023-04-24 DIAGNOSIS — I739 Peripheral vascular disease, unspecified: Secondary | ICD-10-CM | POA: Diagnosis present

## 2023-04-24 DIAGNOSIS — Z8349 Family history of other endocrine, nutritional and metabolic diseases: Secondary | ICD-10-CM | POA: Diagnosis not present

## 2023-04-24 DIAGNOSIS — Z9884 Bariatric surgery status: Secondary | ICD-10-CM | POA: Diagnosis not present

## 2023-04-24 DIAGNOSIS — Z79899 Other long term (current) drug therapy: Secondary | ICD-10-CM | POA: Diagnosis not present

## 2023-04-24 DIAGNOSIS — Z818 Family history of other mental and behavioral disorders: Secondary | ICD-10-CM | POA: Diagnosis not present

## 2023-04-24 DIAGNOSIS — J4489 Other specified chronic obstructive pulmonary disease: Secondary | ICD-10-CM | POA: Diagnosis present

## 2023-04-24 DIAGNOSIS — Z91012 Allergy to eggs: Secondary | ICD-10-CM | POA: Diagnosis not present

## 2023-04-24 DIAGNOSIS — F4310 Post-traumatic stress disorder, unspecified: Secondary | ICD-10-CM | POA: Diagnosis present

## 2023-04-24 DIAGNOSIS — Z9049 Acquired absence of other specified parts of digestive tract: Secondary | ICD-10-CM | POA: Diagnosis not present

## 2023-04-24 DIAGNOSIS — Z91018 Allergy to other foods: Secondary | ICD-10-CM | POA: Diagnosis not present

## 2023-04-24 DIAGNOSIS — Y9355 Activity, bike riding: Secondary | ICD-10-CM | POA: Diagnosis not present

## 2023-04-24 DIAGNOSIS — I10 Essential (primary) hypertension: Secondary | ICD-10-CM | POA: Diagnosis present

## 2023-04-24 DIAGNOSIS — F32A Depression, unspecified: Secondary | ICD-10-CM | POA: Diagnosis present

## 2023-04-24 DIAGNOSIS — S82202A Unspecified fracture of shaft of left tibia, initial encounter for closed fracture: Secondary | ICD-10-CM | POA: Diagnosis present

## 2023-04-24 DIAGNOSIS — K219 Gastro-esophageal reflux disease without esophagitis: Secondary | ICD-10-CM | POA: Diagnosis present

## 2023-04-24 DIAGNOSIS — D62 Acute posthemorrhagic anemia: Secondary | ICD-10-CM | POA: Diagnosis not present

## 2023-04-24 DIAGNOSIS — Z8249 Family history of ischemic heart disease and other diseases of the circulatory system: Secondary | ICD-10-CM | POA: Diagnosis not present

## 2023-04-24 DIAGNOSIS — Z87442 Personal history of urinary calculi: Secondary | ICD-10-CM | POA: Diagnosis not present

## 2023-04-24 DIAGNOSIS — S82142A Displaced bicondylar fracture of left tibia, initial encounter for closed fracture: Secondary | ICD-10-CM | POA: Diagnosis present

## 2023-04-24 DIAGNOSIS — Z91048 Other nonmedicinal substance allergy status: Secondary | ICD-10-CM | POA: Diagnosis not present

## 2023-04-24 DIAGNOSIS — Z86718 Personal history of other venous thrombosis and embolism: Secondary | ICD-10-CM | POA: Diagnosis not present

## 2023-04-24 DIAGNOSIS — K589 Irritable bowel syndrome without diarrhea: Secondary | ICD-10-CM | POA: Diagnosis present

## 2023-04-24 LAB — CBC
HCT: 33.5 % — ABNORMAL LOW (ref 36.0–46.0)
Hemoglobin: 11.6 g/dL — ABNORMAL LOW (ref 12.0–15.0)
MCH: 33 pg (ref 26.0–34.0)
MCHC: 34.6 g/dL (ref 30.0–36.0)
MCV: 95.4 fL (ref 80.0–100.0)
Platelets: 185 10*3/uL (ref 150–400)
RBC: 3.51 MIL/uL — ABNORMAL LOW (ref 3.87–5.11)
RDW: 13.7 % (ref 11.5–15.5)
WBC: 10 10*3/uL (ref 4.0–10.5)
nRBC: 0 % (ref 0.0–0.2)

## 2023-04-24 LAB — BASIC METABOLIC PANEL
Anion gap: 4 — ABNORMAL LOW (ref 5–15)
BUN: 9 mg/dL (ref 6–20)
CO2: 27 mmol/L (ref 22–32)
Calcium: 8.5 mg/dL — ABNORMAL LOW (ref 8.9–10.3)
Chloride: 104 mmol/L (ref 98–111)
Creatinine, Ser: 0.73 mg/dL (ref 0.44–1.00)
GFR, Estimated: >60 mL/min (ref >60)
Glucose, Bld: 124 mg/dL — ABNORMAL HIGH (ref 70–99)
Potassium: 3.8 mmol/L (ref 3.5–5.1)
Sodium: 135 mmol/L (ref 135–145)

## 2023-04-24 LAB — VITAMIN D 25 HYDROXY (VIT D DEFICIENCY, FRACTURES): Vit D, 25-Hydroxy: 73.77 ng/mL (ref 30–100)

## 2023-04-24 MED ORDER — METHOCARBAMOL 1000 MG/10ML IJ SOLN
500.0000 mg | Freq: Four times a day (QID) | INTRAVENOUS | Status: DC
  Filled 2023-04-24: qty 5

## 2023-04-24 MED ORDER — OXYCODONE HCL 10 MG PO TABS: 10.0000 mg | ORAL_TABLET | ORAL | 0 refills | Status: DC | PRN

## 2023-04-24 MED ORDER — METHOCARBAMOL 500 MG PO TABS
500.0000 mg | ORAL_TABLET | Freq: Four times a day (QID) | ORAL | Status: DC
  Administered 2023-04-24 – 2023-04-25 (×3): 500 mg via ORAL
  Filled 2023-04-24 (×3): qty 1

## 2023-04-24 MED ORDER — METHOCARBAMOL 500 MG PO TABS: 500.0000 mg | ORAL_TABLET | Freq: Four times a day (QID) | ORAL | 0 refills | Status: DC

## 2023-04-24 MED ORDER — LORATADINE 10 MG PO TABS
10.0000 mg | ORAL_TABLET | Freq: Every day | ORAL | Status: DC
  Administered 2023-04-24 – 2023-04-25 (×2): 10 mg via ORAL
  Filled 2023-04-24 (×2): qty 1

## 2023-04-24 MED ORDER — APIXABAN 2.5 MG PO TABS: 2.5000 mg | ORAL_TABLET | Freq: Two times a day (BID) | ORAL | 0 refills | Status: DC
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
## 2023-04-24 NOTE — Progress Notes (Addendum)
Orthopaedic Trauma Progress Note  SUBJECTIVE: Doing okay this morning.  Notable muscle spasms throughout the left leg.  Asking for her muscle relaxer now.  Pain fairly well-controlled on current regimen of Tylenol, Dilaudid, Robaxin, oxycodone.  No chest pain. No SOB. No nausea/vomiting. No other complaints.  Has not been up out of bed yet since surgery.  Tolerating diet and fluids.  No BM since admission.  Reports that she does not usually have a BM daily.  Patient notes she was recently diagnosed with PAD within the last year.  Currently follows up with a provider in the [facility].  Asking for referral to transfer care to the [facility] health system.  OBJECTIVE:  Vitals:   04/24/23 0032 04/24/23 0718  BP: 118/64 (!) 110/58  Pulse: 81 76  Resp:  17  Temp: 97.8 F (36.6 C) 98.2 F (36.8 C)  SpO2: 98% 98%    General: Sitting up in bed, no acute distress Respiratory: No increased work of breathing.  Left lower extremity: Dressing clean, dry, intact.  Tenderness throughout the thigh, knee, lower leg.  Tolerates gentle ankle range of motion.  Knee range of motion limited secondary to pain.  Able to wiggle toes.  Dorsal sensation to light touch over all aspects of the foot.+ DP pulse  IMAGING: Stable post op imaging.   LABS:  Results for orders placed or performed during the hospital encounter of 04/22/23 (from the past 24 hour(s))  CBC     Status: None   Collection Time: 04/23/23  7:36 PM  Result Value Ref Range   WBC 7.4 4.0 - 10.5 K/uL   RBC 3.88 3.87 - 5.11 MIL/uL   Hemoglobin 12.9 12.0 - 15.0 g/dL   HCT 16.1 09.6 - 04.5 %   MCV 96.1 80.0 - 100.0 fL   MCH 33.2 26.0 - 34.0 pg   MCHC 34.6 30.0 - 36.0 g/dL   RDW 40.9 81.1 - 91.4 %   Platelets 177 150 - 400 K/uL   nRBC 0.0 0.0 - 0.2 %  Creatinine, serum     Status: None   Collection Time: 04/23/23  7:39 PM  Result Value Ref Range   Creatinine, Ser 0.76 0.44 - 1.00 mg/dL   GFR, Estimated >78 >29 mL/min  CBC     Status:  Abnormal   Collection Time: 04/24/23  4:19 AM  Result Value Ref Range   WBC 10.0 4.0 - 10.5 K/uL   RBC 3.51 (L) 3.87 - 5.11 MIL/uL   Hemoglobin 11.6 (L) 12.0 - 15.0 g/dL   HCT 56.2 (L) 13.0 - 86.5 %   MCV 95.4 80.0 - 100.0 fL   MCH 33.0 26.0 - 34.0 pg   MCHC 34.6 30.0 - 36.0 g/dL   RDW 78.4 69.6 - 29.5 %   Platelets 185 150 - 400 K/uL   nRBC 0.0 0.0 - 0.2 %  Basic metabolic panel     Status: Abnormal   Collection Time: 04/24/23  4:19 AM  Result Value Ref Range   Sodium 135 135 - 145 mmol/L   Potassium 3.8 3.5 - 5.1 mmol/L   Chloride 104 98 - 111 mmol/L   CO2 27 22 - 32 mmol/L   Glucose, Bld 124 (H) 70 - 99 mg/dL   BUN 9 6 - 20 mg/dL   Creatinine, Ser 2.84 0.44 - 1.00 mg/dL   Calcium 8.5 (L) 8.9 - 10.3 mg/dL   GFR, Estimated >13 >24 mL/min   Anion gap 4 (L) 5 - 15  ASSESSMENT: Melinda Harrison is a 57 y.o. female, 1 Day Post-Op s/p  OPEN REDUCTION INTERNAL FIXATION LEFT TIBIAL PLATEAU REPAIR OF LEFT LATERAL MENISCUS  CV/Blood loss: Acute blood loss anemia, Hgb 11.6 this morning. Hemodynamically stable  PLAN: Weightbearing: NWB LLE ROM: Okay for knee ROM as tolerated Incisional and dressing care: Reinforce dressings as needed  Showering: Okay to begin showering getting incisions wet 04/26/2023 Orthopedic device(s): None  Pain management:  1. Tylenol 1000 mg q 6 hours scheduled 2. Robaxin 500 mg 4 times daily 3. Oxycodone 5-15 mg q 4 hours PRN 4. Dilaudid 0.5-1 mg q 4 hours PRN 5. Celebrex 200 mg twice daily VTE prophylaxis: Lovenox, SCDs ID:  Ancef 2gm post op Foley/Lines:  No foley, KVO IVFs Impediments to Fracture Healing: Vitamin D level pending, will start supplementation as indicated Dispo: PT/OT evaluation today.  Continue to work on pain control this morning.  Depending on patient's mobility, plan for discharge home later today versus tomorrow.  Patient agreement with the plan.    D/C recommendations: -Oxycodone, Robaxin for pain control -Eliquis 2.5 mg twice  daily x 30 days for DVT prophylaxis -Possible need for Vit D supplementation  Follow - up plan: 2 weeks after discharge for wound check and repeat x-rays   Contact information:  Truitt Merle MD, Thyra Breed PA-C. After hours and holidays please check [email or web address] for group call information for [facility]   Thompson Caul, PA-C [phone] (office) [email or web address]

## 2023-04-24 NOTE — Care Management Obs Status (Signed)
MEDICARE OBSERVATION STATUS NOTIFICATION   Patient Details  Name: Melinda Harrison MRN: [id number] Date of Birth: [birth year]   Medicare Observation Status Notification Given:  Yes    Lawerance Sabal, RN 04/24/2023, 8:25 AM

## 2023-04-24 NOTE — Discharge Instructions (Signed)
Orthopaedic Trauma Service Discharge Instructions   General Discharge Instructions  WEIGHT BEARING STATUS:Non-weightbearing left lower extremity   RANGE OF MOTION/ACTIVITY: Unrestricted knee range of motion  Wound Care: You may remove your surgical dressing on post-op day #2, (Wednesday, 04/25/23). Incisions can be left open to air if there is no drainage. Once the incision is completely dry and without drainage, it may be left open to air out.  Showering may begin post-op day #3, (Tuesday, 04/26/23 ).  Clean incision gently with soap and water.  DVT/PE prophylaxis:  Eliquis 2.5 mg twice daily x 30 days  Diet: as you were eating previously.  Can use over the counter stool softeners and bowel preparations, such as Miralax, to help with bowel movements.  Narcotics can be constipating.  Be sure to drink plenty of fluids  PAIN MEDICATION USE AND EXPECTATIONS  You have likely been given narcotic medications to help control your pain.  After a traumatic event that results in an fracture (broken bone) with or without surgery, it is ok to use narcotic pain medications to help control one's pain.  We understand that everyone responds to pain differently and each individual patient will be evaluated on a regular basis for the continued need for narcotic medications. Ideally, narcotic medication use should last no more than 6-8 weeks (coinciding with fracture healing).   As a patient it is your responsibility as well to monitor narcotic medication use and report the amount and frequency you use these medications when you come to your office visit.   We would also advise that if you are using narcotic medications, you should take a dose prior to therapy to maximize you participation.  IF YOU ARE ON NARCOTIC MEDICATIONS IT IS NOT PERMISSIBLE TO OPERATE A MOTOR VEHICLE (MOTORCYCLE/CAR/TRUCK/MOPED) OR HEAVY MACHINERY DO NOT MIX NARCOTICS WITH OTHER CNS (CENTRAL NERVOUS SYSTEM) DEPRESSANTS SUCH AS  ALCOHOL   STOP SMOKING OR USING NICOTINE PRODUCTS!!!!  As discussed nicotine severely impairs your body's ability to heal surgical and traumatic wounds but also impairs bone healing.  Wounds and bone heal by forming microscopic blood vessels (angiogenesis) and nicotine is a vasoconstrictor (essentially, shrinks blood vessels).  Therefore, if vasoconstriction occurs to these microscopic blood vessels they essentially disappear and are unable to deliver necessary nutrients to the healing tissue.  This is one modifiable factor that you can do to dramatically increase your chances of healing your injury.    (This means no smoking, no nicotine gum, patches, etc)  DO NOT USE NONSTEROIDAL ANTI-INFLAMMATORY DRUGS (NSAID'S)  Using products such as Advil (ibuprofen), Aleve (naproxen), Motrin (ibuprofen) for additional pain control during fracture healing can delay and/or prevent the healing response.  If you would like to take over the counter (OTC) medication, Tylenol (acetaminophen) is ok.  However, some narcotic medications that are given for pain control contain acetaminophen as well. Therefore, you should not exceed more than 4000 mg of tylenol in a day if you do not have liver disease.  Also note that there are may OTC medicines, such as cold medicines and allergy medicines that my contain tylenol as well.  If you have any questions about medications and/or interactions please ask your doctor/PA or your pharmacist.      ICE AND ELEVATE INJURED/OPERATIVE EXTREMITY  Using ice and elevating the injured extremity above your heart can help with swelling and pain control.  Icing in a pulsatile fashion, such as 20 minutes on and 20 minutes off, can be followed.  Do not place ice directly on skin. Make sure there is a barrier between to skin and the ice pack.    Using frozen items such as frozen peas works well as the conform nicely to the are that needs to be iced.  USE AN ACE WRAP OR TED HOSE FOR SWELLING  CONTROL  In addition to icing and elevation, Ace wraps or TED hose are used to help limit and resolve swelling.  It is recommended to use Ace wraps or TED hose until you are informed to stop.    When using Ace Wraps start the wrapping distally (farthest away from the body) and wrap proximally (closer to the body)   Example: If you had surgery on your leg or thing and you do not have a splint on, start the ace wrap at the toes and work your way up to the thigh        If you had surgery on your upper extremity and do not have a splint on, start the ace wrap at your fingers and work your way up to the upper arm  CALL THE OFFICE WITH ANY QUESTIONS OR CONCERNS: [phone]   VISIT OUR WEBSITE FOR ADDITIONAL INFORMATION: [email or web address]    Discharge Wound Care Instructions  Do NOT apply any ointments, solutions or lotions to pin sites or surgical wounds.  These prevent needed drainage and even though solutions like hydrogen peroxide kill bacteria, they also damage cells lining the pin sites that help fight infection.  Applying lotions or ointments can keep the wounds moist and can cause them to breakdown and open up as well. This can increase the risk for infection. When in doubt call the office.  If any drainage is noted, use one layer of adaptic or Mepitel, then gauze, Kerlix, and an ace wrap. - These dressing supplies should be available at local medical supply stores [facility], [facility], etc) as well as Insurance claims handler ([facility], [facility], [place], etc)  Once the incision is completely dry and without drainage, it may be left open to air out.  Showering may begin 36-48 hours later.  Cleaning gently with soap and water.
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
## 2023-04-25 LAB — CBC
HCT: 32.9 % — ABNORMAL LOW (ref 36.0–46.0)
Hemoglobin: 11 g/dL — ABNORMAL LOW (ref 12.0–15.0)
MCH: 33.3 pg (ref 26.0–34.0)
MCHC: 33.4 g/dL (ref 30.0–36.0)
MCV: 99.7 fL (ref 80.0–100.0)
Platelets: 184 10*3/uL (ref 150–400)
RBC: 3.3 MIL/uL — ABNORMAL LOW (ref 3.87–5.11)
RDW: 14.2 % (ref 11.5–15.5)
WBC: 8.9 10*3/uL (ref 4.0–10.5)
nRBC: 0 % (ref 0.0–0.2)

## 2023-04-25 NOTE — Progress Notes (Signed)
Discharge summary(AVS) packet provided to pt with instructions. Wound care management has been discussed at length.  Pt verbalized understanding  of instructions NO complaints voiced. Pt d/c to home as ordered. A family member is responsible for her ride.

## 2023-04-25 NOTE — TOC Transition Note (Signed)
Transition of Care [facility]) - CM/SW Discharge Note   Patient Details  Name: Melinda Harrison MRN: [id number] Date of Birth: May 25, 1966  Transition of Care [facility]) CM/SW Contact:  Lawerance Sabal, RN Phone Number: 04/25/2023, 10:07 AM   Clinical Narrative:     Patient provided with 30 day ELiquis coupon. No other TOC needs identified for DC     Barriers to Discharge: Continued Medical Work up   Patient Goals and CMS Choice      Discharge Placement                         Discharge Plan and Services Additional resources added to the After Visit Summary for     Discharge Planning Services: CM Consult                                 Social Determinants of Health (SDOH) Interventions SDOH Screenings   Food Insecurity: No Food Insecurity (04/23/2023)  Housing: Low Risk  (04/23/2023)  Transportation Needs: No Transportation Needs (04/23/2023)  Utilities: Not At Risk (04/23/2023)  Tobacco Use: Low Risk  (04/24/2023)     Readmission Risk Interventions     No data to display
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
## 2024-01-04 ENCOUNTER — Emergency Department (HOSPITAL_COMMUNITY): Payer: Medicare HMO

## 2024-01-04 ENCOUNTER — Inpatient Hospital Stay (HOSPITAL_COMMUNITY)
Admission: EM | Admit: 2024-01-04 | Discharge: 2024-01-10 | DRG: 481 | Disposition: A | Discharge status: Home Health | Payer: Medicare HMO | Attending: Student | Admitting: Student

## 2024-01-04 ENCOUNTER — Inpatient Hospital Stay (HOSPITAL_COMMUNITY): Payer: Medicare HMO

## 2024-01-04 ENCOUNTER — Other Ambulatory Visit: Payer: Self-pay

## 2024-01-04 DIAGNOSIS — Z91048 Other nonmedicinal substance allergy status: Secondary | ICD-10-CM

## 2024-01-04 DIAGNOSIS — D62 Acute posthemorrhagic anemia: Secondary | ICD-10-CM | POA: Diagnosis not present

## 2024-01-04 DIAGNOSIS — W19XXXA Unspecified fall, initial encounter: Principal | ICD-10-CM

## 2024-01-04 DIAGNOSIS — J4489 Other specified chronic obstructive pulmonary disease: Secondary | ICD-10-CM | POA: Diagnosis present

## 2024-01-04 DIAGNOSIS — Z91012 Allergy to eggs: Secondary | ICD-10-CM | POA: Diagnosis not present

## 2024-01-04 DIAGNOSIS — I739 Peripheral vascular disease, unspecified: Secondary | ICD-10-CM | POA: Diagnosis present

## 2024-01-04 DIAGNOSIS — Z79899 Other long term (current) drug therapy: Secondary | ICD-10-CM | POA: Diagnosis not present

## 2024-01-04 DIAGNOSIS — S7292XA Unspecified fracture of left femur, initial encounter for closed fracture: Secondary | ICD-10-CM | POA: Diagnosis present

## 2024-01-04 DIAGNOSIS — I10 Essential (primary) hypertension: Secondary | ICD-10-CM | POA: Diagnosis present

## 2024-01-04 DIAGNOSIS — F4310 Post-traumatic stress disorder, unspecified: Secondary | ICD-10-CM | POA: Diagnosis present

## 2024-01-04 DIAGNOSIS — Z86718 Personal history of other venous thrombosis and embolism: Secondary | ICD-10-CM | POA: Diagnosis not present

## 2024-01-04 DIAGNOSIS — E739 Lactose intolerance, unspecified: Secondary | ICD-10-CM | POA: Diagnosis present

## 2024-01-04 DIAGNOSIS — S72462A Displaced supracondylar fracture with intracondylar extension of lower end of left femur, initial encounter for closed fracture: Secondary | ICD-10-CM | POA: Diagnosis not present

## 2024-01-04 DIAGNOSIS — Z8349 Family history of other endocrine, nutritional and metabolic diseases: Secondary | ICD-10-CM

## 2024-01-04 DIAGNOSIS — Z79818 Long term (current) use of other agents affecting estrogen receptors and estrogen levels: Secondary | ICD-10-CM | POA: Diagnosis not present

## 2024-01-04 DIAGNOSIS — Z8249 Family history of ischemic heart disease and other diseases of the circulatory system: Secondary | ICD-10-CM

## 2024-01-04 DIAGNOSIS — S72402A Unspecified fracture of lower end of left femur, initial encounter for closed fracture: Secondary | ICD-10-CM | POA: Diagnosis present

## 2024-01-04 DIAGNOSIS — K219 Gastro-esophageal reflux disease without esophagitis: Secondary | ICD-10-CM | POA: Diagnosis present

## 2024-01-04 DIAGNOSIS — F419 Anxiety disorder, unspecified: Secondary | ICD-10-CM | POA: Diagnosis present

## 2024-01-04 DIAGNOSIS — Z9102 Food additives allergy status: Secondary | ICD-10-CM

## 2024-01-04 DIAGNOSIS — F32A Depression, unspecified: Secondary | ICD-10-CM | POA: Diagnosis present

## 2024-01-04 DIAGNOSIS — Z818 Family history of other mental and behavioral disorders: Secondary | ICD-10-CM | POA: Diagnosis not present

## 2024-01-04 DIAGNOSIS — W11XXXA Fall on and from ladder, initial encounter: Secondary | ICD-10-CM | POA: Diagnosis present

## 2024-01-04 DIAGNOSIS — Z832 Family history of diseases of the blood and blood-forming organs and certain disorders involving the immune mechanism: Secondary | ICD-10-CM | POA: Diagnosis not present

## 2024-01-04 DIAGNOSIS — Z7901 Long term (current) use of anticoagulants: Secondary | ICD-10-CM | POA: Diagnosis not present

## 2024-01-04 DIAGNOSIS — Z9884 Bariatric surgery status: Secondary | ICD-10-CM | POA: Diagnosis not present

## 2024-01-04 DIAGNOSIS — F418 Other specified anxiety disorders: Secondary | ICD-10-CM | POA: Diagnosis not present

## 2024-01-04 DIAGNOSIS — J449 Chronic obstructive pulmonary disease, unspecified: Secondary | ICD-10-CM | POA: Diagnosis not present

## 2024-01-04 LAB — I-STAT CHEM 8, ED
BUN: 23 mg/dL — ABNORMAL HIGH (ref 6–20)
Calcium, Ion: 1.09 mmol/L — ABNORMAL LOW (ref 1.15–1.40)
Chloride: 99 mmol/L (ref 98–111)
Creatinine, Ser: 0.9 mg/dL (ref 0.44–1.00)
Glucose, Bld: 108 mg/dL — ABNORMAL HIGH (ref 70–99)
HCT: 37 % (ref 36.0–46.0)
Hemoglobin: 12.6 g/dL (ref 12.0–15.0)
Potassium: 3.4 mmol/L — ABNORMAL LOW (ref 3.5–5.1)
Sodium: 137 mmol/L (ref 135–145)
TCO2: 28 mmol/L (ref 22–32)

## 2024-01-04 LAB — MRSA NEXT GEN BY PCR, NASAL: MRSA by PCR Next Gen: NOT DETECTED

## 2024-01-04 MED ORDER — METHYLPHENIDATE HCL 10 MG PO TABS: 10.0000 mg | ORAL_TABLET | Freq: Every morning | ORAL | Status: DC

## 2024-01-04 MED ORDER — NITROFURANTOIN MONOHYD MACRO 100 MG PO CAPS
100.0000 mg | ORAL_CAPSULE | Freq: Two times a day (BID) | ORAL | Status: AC
  Administered 2024-01-04 – 2024-01-06 (×5): 100 mg via ORAL
  Filled 2024-01-04 (×6): qty 1

## 2024-01-04 MED ORDER — TRAZODONE HCL 50 MG PO TABS
200.0000 mg | ORAL_TABLET | Freq: Every evening | ORAL | Status: DC | PRN
  Administered 2024-01-05 – 2024-01-09 (×6): 200 mg via ORAL
  Filled 2024-01-04 (×6): qty 4

## 2024-01-04 MED ORDER — DULOXETINE HCL 60 MG PO CPEP
120.0000 mg | ORAL_CAPSULE | Freq: Every morning | ORAL | Status: DC
  Administered 2024-01-05 – 2024-01-10 (×5): 120 mg via ORAL
  Filled 2024-01-04 (×5): qty 2

## 2024-01-04 MED ORDER — ESCITALOPRAM OXALATE 10 MG PO TABS
5.0000 mg | ORAL_TABLET | Freq: Two times a day (BID) | ORAL | Status: DC
  Administered 2024-01-05 – 2024-01-10 (×10): 5 mg via ORAL
  Filled 2024-01-04 (×10): qty 1

## 2024-01-04 MED ORDER — ALPRAZOLAM 0.25 MG PO TABS
0.2500 mg | ORAL_TABLET | Freq: Every evening | ORAL | Status: DC | PRN
  Administered 2024-01-04 – 2024-01-09 (×6): 0.25 mg via ORAL
  Filled 2024-01-04 (×7): qty 1

## 2024-01-04 MED ORDER — ENOXAPARIN SODIUM 40 MG/0.4ML IJ SOSY
40.0000 mg | PREFILLED_SYRINGE | INTRAMUSCULAR | Status: DC
  Administered 2024-01-04 – 2024-01-06 (×3): 40 mg via SUBCUTANEOUS
  Filled 2024-01-04 (×2): qty 0.4

## 2024-01-04 MED ORDER — HYDROMORPHONE HCL 1 MG/ML IJ SOLN
0.5000 mg | Freq: Once | INTRAMUSCULAR | Status: AC
  Administered 2024-01-04: 0.5 mg via INTRAVENOUS
  Filled 2024-01-04: qty 1

## 2024-01-04 MED ORDER — VENLAFAXINE HCL ER 37.5 MG PO CP24
37.5000 mg | ORAL_CAPSULE | ORAL | Status: DC
  Administered 2024-01-06 – 2024-01-10 (×3): 37.5 mg via ORAL
  Filled 2024-01-04 (×3): qty 1

## 2024-01-04 MED ORDER — OXYCODONE HCL 5 MG PO TABS
5.0000 mg | ORAL_TABLET | ORAL | Status: DC | PRN
  Administered 2024-01-04 – 2024-01-09 (×16): 15 mg via ORAL
  Administered 2024-01-09: 10 mg via ORAL
  Administered 2024-01-09 – 2024-01-10 (×4): 15 mg via ORAL
  Filled 2024-01-04 (×3): qty 3
  Filled 2024-01-04: qty 2
  Filled 2024-01-04 (×17): qty 3

## 2024-01-04 MED ORDER — FENTANYL CITRATE PF 50 MCG/ML IJ SOSY
75.0000 ug | PREFILLED_SYRINGE | Freq: Once | INTRAMUSCULAR | Status: AC
  Administered 2024-01-04: 75 ug via INTRAVENOUS
  Filled 2024-01-04: qty 2

## 2024-01-04 MED ORDER — MORPHINE SULFATE (PF) 2 MG/ML IV SOLN
2.0000 mg | INTRAVENOUS | Status: DC | PRN
  Administered 2024-01-04 – 2024-01-08 (×11): 2 mg via INTRAVENOUS
  Filled 2024-01-04 (×12): qty 1

## 2024-01-04 MED ORDER — CEPHALEXIN 250 MG PO CAPS
250.0000 mg | ORAL_CAPSULE | Freq: Every day | ORAL | Status: DC
Start: 2024-01-05 — End: 2024-01-07
  Administered 2024-01-05 – 2024-01-06 (×2): 250 mg via ORAL
  Filled 2024-01-04 (×3): qty 1

## 2024-01-04 MED ORDER — FUROSEMIDE 40 MG PO TABS
40.0000 mg | ORAL_TABLET | Freq: Every day | ORAL | Status: DC
  Administered 2024-01-05 – 2024-01-10 (×5): 40 mg via ORAL
  Filled 2024-01-04 (×5): qty 1

## 2024-01-04 MED ORDER — ONDANSETRON HCL 4 MG/2ML IJ SOLN: 4.0000 mg | Freq: Four times a day (QID) | INTRAMUSCULAR | Status: DC | PRN

## 2024-01-04 MED ORDER — LORATADINE 10 MG PO TABS
10.0000 mg | ORAL_TABLET | Freq: Every day | ORAL | Status: DC
  Administered 2024-01-05 – 2024-01-10 (×5): 10 mg via ORAL
  Filled 2024-01-04 (×5): qty 1

## 2024-01-04 MED ORDER — METHOCARBAMOL 500 MG PO TABS
500.0000 mg | ORAL_TABLET | Freq: Four times a day (QID) | ORAL | Status: DC | PRN
  Administered 2024-01-04 – 2024-01-08 (×7): 500 mg via ORAL
  Filled 2024-01-04 (×7): qty 1

## 2024-01-04 MED ORDER — LACTATED RINGERS IV BOLUS
500.0000 mL | Freq: Once | INTRAVENOUS | Status: AC
  Administered 2024-01-04: 500 mL via INTRAVENOUS

## 2024-01-04 MED ORDER — SPIRONOLACTONE 25 MG PO TABS
25.0000 mg | ORAL_TABLET | Freq: Every day | ORAL | Status: DC
  Administered 2024-01-05 – 2024-01-10 (×5): 25 mg via ORAL
  Filled 2024-01-04 (×5): qty 1

## 2024-01-04 MED ORDER — LISINOPRIL 5 MG PO TABS
5.0000 mg | ORAL_TABLET | Freq: Every day | ORAL | Status: DC
  Administered 2024-01-05 – 2024-01-10 (×5): 5 mg via ORAL
  Filled 2024-01-04 (×5): qty 1

## 2024-01-04 MED ORDER — ONDANSETRON HCL 4 MG PO TABS: 4.0000 mg | ORAL_TABLET | Freq: Four times a day (QID) | ORAL | Status: DC | PRN

## 2024-01-04 MED ORDER — IOHEXOL 350 MG/ML SOLN
75.0000 mL | Freq: Once | INTRAVENOUS | Status: AC | PRN
  Administered 2024-01-04: 75 mL via INTRAVENOUS

## 2024-01-04 MED ORDER — METHOCARBAMOL 1000 MG/10ML IJ SOLN
500.0000 mg | Freq: Four times a day (QID) | INTRAMUSCULAR | Status: DC | PRN
  Administered 2024-01-07: 500 mg via INTRAVENOUS

## 2024-01-04 MED ORDER — FLUTICASONE PROPIONATE 50 MCG/ACT NA SUSP
1.0000 | Freq: Every day | NASAL | Status: DC
  Administered 2024-01-04 – 2024-01-10 (×6): 1 via NASAL
  Filled 2024-01-04: qty 16

## 2024-01-04 MED ORDER — ONDANSETRON HCL 4 MG/2ML IJ SOLN
4.0000 mg | Freq: Once | INTRAMUSCULAR | Status: AC
  Administered 2024-01-04: 4 mg via INTRAVENOUS
  Filled 2024-01-04: qty 2

## 2024-01-04 MED ORDER — FENTANYL CITRATE PF 50 MCG/ML IJ SOSY
50.0000 ug | PREFILLED_SYRINGE | Freq: Once | INTRAMUSCULAR | Status: AC
  Administered 2024-01-04: 50 ug via INTRAVENOUS
  Filled 2024-01-04: qty 1

## 2024-01-04 NOTE — ED Notes (Signed)
Michael PA at bedside

## 2024-01-04 NOTE — ED Notes (Signed)
 EDP at bedside

## 2024-01-04 NOTE — H&P (View-Only) (Signed)
 Reason for Consult:Left distal femur fx Referring Physician: Glendia Breeding Time called: 1449 Time at bedside: 1525   Melinda Harrison is an 58 y.o. female.  HPI: Melinda Harrison was up a ladder painting and fell about 4 feet. She had immediate pain in her left knee and neck. She was brought to the ED where workup showed a left distal femur fx and orthopedic surgery was consulted.   Past Medical History:  Diagnosis Date   Anemia    H/O   Anxiety    Arthritis    Asthma    Back pain    COPD (chronic obstructive pulmonary disease) (HCC)    Depression    GERD (gastroesophageal reflux disease)    H/O PRIOR TO GASTRIC BYPASS   Headache    History of DVT (deep vein thrombosis)    Occurred in 2017   History of hiatal hernia    H/O   History of kidney stones    H/O   Hypertension    H/O -NO MEDS SINCE HAVING GASTRIC BYPASS   IBS (irritable bowel syndrome)    PAD (peripheral artery disease) (HCC)    PTSD (post-traumatic stress disorder)    PTSD (post-traumatic stress disorder)     Past Surgical History:  Procedure Laterality Date   CHOLECYSTECTOMY     DILATION AND CURETTAGE OF UTERUS     GASTRIC ROUX-EN-Y N/A 11/23/2015   Procedure: LAPAROSCOPIC ROUX-EN-Y GASTRIC BYPASS, HIATAL HERNIA REPAIR;  Surgeon: Ozell DELENA Mano, MD;  Location: [facility] ORS;  Service: General;  Laterality: N/A;   MENISCUS REPAIR Left 04/23/2023   Procedure: REPAIR OF MENISCUS;  Surgeon: Kendal Franky SQUIBB, MD;  Location: [facility] OR;  Service: Orthopedics;  Laterality: Left;   NECK SURGERY     ORIF TIBIA PLATEAU Left 04/23/2023   Procedure: OPEN REDUCTION INTERNAL FIXATION (ORIF) TIBIAL PLATEAU;  Surgeon: Kendal Franky SQUIBB, MD;  Location: [facility] OR;  Service: Orthopedics;  Laterality: Left;   TONSILLECTOMY      Family History  Problem Relation Age of Onset   Hypertension Mother    Thyroid disease Mother    Heart disease Mother    Heart failure Mother    Deep vein thrombosis Father    Hypertension Father    Depression Brother     Hypertension Brother    Depression Brother    Hypertension Brother     Social History:  reports that she has never smoked. She has never used smokeless tobacco. She reports current alcohol use. She reports that she does not use drugs.  Allergies:  Allergies  Allergen Reactions   Tea Anaphylaxis   Egg-Derived Products Swelling and Other (See Comments)   Lactose Intolerance (Gi)    Tape Rash    PAPER TAPE OK TO USE    Medications: I have reviewed the patient's current medications.  Results for orders placed or performed during the hospital encounter of 01/04/24 (from the past 48 hours)  I-stat chem 8, ED (not at [facility], [facility] or [facility])     Status: Abnormal   Collection Time: 01/04/24  1:55 PM  Result Value Ref Range   Sodium 137 135 - 145 mmol/L   Potassium 3.4 (L) 3.5 - 5.1 mmol/L   Chloride 99 98 - 111 mmol/L   BUN 23 (H) 6 - 20 mg/dL   Creatinine, Ser 9.09 0.44 - 1.00 mg/dL   Glucose, Bld 891 (H) 70 - 99 mg/dL    Comment: Glucose reference range applies only to samples taken after fasting for at  least 8 hours.   Calcium, Ion 1.09 (L) 1.15 - 1.40 mmol/L   TCO2 28 22 - 32 mmol/L   Hemoglobin 12.6 12.0 - 15.0 g/dL   HCT 62.9 63.9 - 53.9 %    CT Thoracic Spine Wo Contrast Result Date: 01/04/2024 CLINICAL DATA:  Mid-back pain; Low back pain, trauma EXAM: CT THORACIC AND LUMBAR SPINE WITHOUT CONTRAST TECHNIQUE: Multidetector CT imaging of the thoracic and lumbar spine was performed without intravenous contrast. Multiplanar CT image reconstructions were also generated. RADIATION DOSE REDUCTION: This exam was performed according to the departmental dose-optimization program which includes automated exposure control, adjustment of the mA and/or kV according to patient size and/or use of iterative reconstruction technique. COMPARISON:  CT AP 08/27/19 FINDINGS: CT THORACIC SPINE FINDINGS Alignment: Normal. Vertebrae: No acute fracture or focal pathologic process. Mild chronic appearing height  loss at the superior endplates of T1-T4. Unchanged mild superior endplate compression deformity at T12 compared to 2020 Paraspinal and other soft tissues: 5 mm subpleural nodule in the right lower lobe (series 7, image 73). Disc levels: No CT evidence of high-grade spinal canal stenosis. CT LUMBAR SPINE FINDINGS Segmentation: 5 lumbar type vertebrae. Alignment: Grade 1 anterolisthesis of L4 on L5 Vertebrae: No acute fracture or focal pathologic process. Paraspinal and other soft tissues: Postsurgical changes from prior gastric bypass surgery. Status post cholecystectomy. Diverticulosis without evidence of diverticulitis. Disc levels: No CT evidence of high-grade spinal canal stenosis. IMPRESSION: 1. No acute fracture or traumatic listhesis in the thoracic or lumbar spine. 2. 5 mm subpleural nodule in the right lower lobe. If the patient is high risk for lung cancer a follow up CT in 12 months could be obtained to ensure stability. Electronically Signed   By: Lyndall Gore M.D.   On: 01/04/2024 15:24   CT Lumbar Spine Wo Contrast Result Date: 01/04/2024 CLINICAL DATA:  Mid-back pain; Low back pain, trauma EXAM: CT THORACIC AND LUMBAR SPINE WITHOUT CONTRAST TECHNIQUE: Multidetector CT imaging of the thoracic and lumbar spine was performed without intravenous contrast. Multiplanar CT image reconstructions were also generated. RADIATION DOSE REDUCTION: This exam was performed according to the departmental dose-optimization program which includes automated exposure control, adjustment of the mA and/or kV according to patient size and/or use of iterative reconstruction technique. COMPARISON:  CT AP 08/27/19 FINDINGS: CT THORACIC SPINE FINDINGS Alignment: Normal. Vertebrae: No acute fracture or focal pathologic process. Mild chronic appearing height loss at the superior endplates of T1-T4. Unchanged mild superior endplate compression deformity at T12 compared to 2020 Paraspinal and other soft tissues: 5 mm subpleural  nodule in the right lower lobe (series 7, image 73). Disc levels: No CT evidence of high-grade spinal canal stenosis. CT LUMBAR SPINE FINDINGS Segmentation: 5 lumbar type vertebrae. Alignment: Grade 1 anterolisthesis of L4 on L5 Vertebrae: No acute fracture or focal pathologic process. Paraspinal and other soft tissues: Postsurgical changes from prior gastric bypass surgery. Status post cholecystectomy. Diverticulosis without evidence of diverticulitis. Disc levels: No CT evidence of high-grade spinal canal stenosis. IMPRESSION: 1. No acute fracture or traumatic listhesis in the thoracic or lumbar spine. 2. 5 mm subpleural nodule in the right lower lobe. If the patient is high risk for lung cancer a follow up CT in 12 months could be obtained to ensure stability. Electronically Signed   By: Lyndall Gore M.D.   On: 01/04/2024 15:24   CT Head Wo Contrast Result Date: 01/04/2024 CLINICAL DATA:  Trauma, fall from ladder neck pain EXAM: CT HEAD WITHOUT CONTRAST CT  CERVICAL SPINE WITHOUT CONTRAST TECHNIQUE: Multidetector CT imaging of the head and cervical spine was performed following the standard protocol without intravenous contrast. Multiplanar CT image reconstructions of the cervical spine were also generated. RADIATION DOSE REDUCTION: This exam was performed according to the departmental dose-optimization program which includes automated exposure control, adjustment of the mA and/or kV according to patient size and/or use of iterative reconstruction technique. COMPARISON:  None Available. FINDINGS: CT HEAD FINDINGS Brain: No evidence of acute infarction, hemorrhage, hydrocephalus, extra-axial collection or mass lesion/mass effect. Vascular: No hyperdense vessel or unexpected calcification. Skull: Normal. Negative for fracture or focal lesion. Sinuses/Orbits: No acute finding. Other: None. CT CERVICAL SPINE FINDINGS Alignment: Normal. Skull base and vertebrae: No acute fracture. No primary bone lesion or focal  pathologic process. Soft tissues and spinal canal: No prevertebral fluid or swelling. No visible canal hematoma. Disc levels: Moderate disc space height loss and osteophytosis of the lower cervical levels, worst from C5-C7. Upper chest: Negative. Other: None. IMPRESSION: 1. No acute intracranial pathology. 2. No fracture or static subluxation of the cervical spine. 3. Moderate disc space height loss and osteophytosis of the lower cervical levels, worst from C5-C7. Electronically Signed   By: Marolyn JONETTA Jaksch M.D.   On: 01/04/2024 15:17   CT Cervical Spine Wo Contrast Result Date: 01/04/2024 CLINICAL DATA:  Trauma, fall from ladder neck pain EXAM: CT HEAD WITHOUT CONTRAST CT CERVICAL SPINE WITHOUT CONTRAST TECHNIQUE: Multidetector CT imaging of the head and cervical spine was performed following the standard protocol without intravenous contrast. Multiplanar CT image reconstructions of the cervical spine were also generated. RADIATION DOSE REDUCTION: This exam was performed according to the departmental dose-optimization program which includes automated exposure control, adjustment of the mA and/or kV according to patient size and/or use of iterative reconstruction technique. COMPARISON:  None Available. FINDINGS: CT HEAD FINDINGS Brain: No evidence of acute infarction, hemorrhage, hydrocephalus, extra-axial collection or mass lesion/mass effect. Vascular: No hyperdense vessel or unexpected calcification. Skull: Normal. Negative for fracture or focal lesion. Sinuses/Orbits: No acute finding. Other: None. CT CERVICAL SPINE FINDINGS Alignment: Normal. Skull base and vertebrae: No acute fracture. No primary bone lesion or focal pathologic process. Soft tissues and spinal canal: No prevertebral fluid or swelling. No visible canal hematoma. Disc levels: Moderate disc space height loss and osteophytosis of the lower cervical levels, worst from C5-C7. Upper chest: Negative. Other: None. IMPRESSION: 1. No acute intracranial  pathology. 2. No fracture or static subluxation of the cervical spine. 3. Moderate disc space height loss and osteophytosis of the lower cervical levels, worst from C5-C7. Electronically Signed   By: Marolyn JONETTA Jaksch M.D.   On: 01/04/2024 15:17   DG Tibia/Fibula Left Result Date: 01/04/2024 CLINICAL DATA:  Clemens from ladder EXAM: LEFT TIBIA AND FIBULA - 2 VIEW COMPARISON:  None Available. FINDINGS: Minimally comminuted oblique fracture of the distal femur involving the lateral condyle, proximal margin not visualized. Stable orthopedic fixation hardware in the proximal tibia. Distal tibial shaft, and fibula are normal. IMPRESSION: Minimally comminuted oblique fracture of the distal femur involving the lateral condyle. Electronically Signed   By: JONETTA Faes M.D.   On: 01/04/2024 14:41   DG Ankle Complete Left Result Date: 01/04/2024 CLINICAL DATA:  Clemens from ladder EXAM: LEFT ANKLE COMPLETE - 3+ VIEW COMPARISON:  04/22/2023 FINDINGS: There is no evidence of fracture, dislocation, or joint effusion. There is no evidence of arthropathy or other focal bone abnormality. Soft tissues are unremarkable. IMPRESSION: Negative. Electronically Signed   By: JONETTA  Johann M.D.   On: 01/04/2024 14:40   DG Knee Complete 4 Views Left Result Date: 01/04/2024 CLINICAL DATA:  Clemens from ladder EXAM: LEFT KNEE - COMPLETE 4+ VIEW COMPARISON:  04/23/2023 and previous FINDINGS: Oblique fracture of the distal femoral shaft extending to the intercondylar notch, incompletely visualized proximally. Fat/fluid level in the suprapatellar bursa. Diffuse osteopenia. Previous orthopedic fixation of the tibial plateau, stable. IMPRESSION: 1. Oblique fracture of the distal femoral shaft extending to the intercondylar notch. 2. Fat/fluid level in the suprapatellar bursa. Electronically Signed   By: JONETTA Johann M.D.   On: 01/04/2024 14:38   DG Pelvis Portable Result Date: 01/04/2024 CLINICAL DATA:  Clemens from ladder EXAM: PORTABLE PELVIS 1-2 VIEWS  COMPARISON:  02/11/2014 FINDINGS: There is no evidence of pelvic fracture or diastasis. No pelvic bone lesions are seen. IMPRESSION: Negative. Electronically Signed   By: JONETTA Johann M.D.   On: 01/04/2024 14:36    Review of Systems  HENT:  Negative for ear discharge, ear pain, hearing loss and tinnitus.   Eyes:  Negative for photophobia and pain.  Respiratory:  Negative for cough and shortness of breath.   Cardiovascular:  Negative for chest pain.  Gastrointestinal:  Negative for abdominal pain, nausea and vomiting.  Genitourinary:  Negative for dysuria, flank pain, frequency and urgency.  Musculoskeletal:  Positive for arthralgias (Left knee) and neck pain. Negative for back pain and myalgias.  Neurological:  Negative for dizziness and headaches.  Hematological:  Does not bruise/bleed easily.  Psychiatric/Behavioral:  The patient is not nervous/anxious.    Blood pressure (!) 147/82, pulse 82, temperature 97.9 F (36.6 C), temperature source Oral, resp. rate 20, height 5' 6 (1.676 m), weight 73.5 kg, last menstrual period 03/09/2016, SpO2 99%. Physical Exam Constitutional:      General: She is not in acute distress.    Appearance: She is well-developed. She is not diaphoretic.  HENT:     Head: Normocephalic and atraumatic.  Eyes:     General: No scleral icterus.       Right eye: No discharge.        Left eye: No discharge.     Conjunctiva/sclera: Conjunctivae normal.  Cardiovascular:     Rate and Rhythm: Normal rate and regular rhythm.  Pulmonary:     Effort: Pulmonary effort is normal. No respiratory distress.  Musculoskeletal:     Cervical back: Normal range of motion.     Comments: LLE No traumatic wounds, ecchymosis, or rash  Severe TTP knee  No ankle effusion  Sens DPN, SPN, TN intact  Motor EHL, ext, flex, evers 5/5  DP 2+, PT 2+, No significant edema  Skin:    General: Skin is warm and dry.  Neurological:     Mental Status: She is alert.  Psychiatric:        Mood  and Affect: Mood normal.        Behavior: Behavior normal.     Assessment/Plan: Left distal femur fx -- Plan ORIF Monday with Dr. Kendal. NPO after MN Sunday.    Ozell DOROTHA Ned, PA-C Orthopedic Surgery [phone] 01/04/2024, 3:33 PM

## 2024-01-04 NOTE — ED Notes (Signed)
 Patient transported to CT by this RN

## 2024-01-04 NOTE — ED Provider Notes (Signed)
 [place] [facility] Provider Note   CSN: [id number] Arrival date & time: 01/04/24  1313     History  Chief Complaint  Patient presents with   Melinda Harrison    Melinda Harrison is a 58 y.o. female.  58 year old female presents today for concern of fall that occurred just prior to arrival.  She was on a stepladder about 4 feet off the ground when she fell.  She is unsure if she lost consciousness.  Complains of severe left knee pain.  She had a procedure performed on the left knee in April of last year.  She had no issues until the fall today with the left knee.  She is in a c-collar currently.  Has not been able to bear any weight on that left leg.  Refuses to move it secondary to pain.  The history is provided by the patient. No language interpreter was used.       Home Medications Prior to Admission medications   Medication Sig Start Date End Date Taking? Authorizing Provider  acetaminophen  (TYLENOL ) 500 MG tablet Take 500 mg by mouth every 6 (six) hours as needed for moderate pain, mild pain, headache or fever.    [provider]  ALPRAZolam  (XANAX ) 0.25 MG tablet Take 0.25 mg by mouth at bedtime as needed for anxiety.    [provider]  apixaban  (ELIQUIS ) 2.5 MG TABS tablet Take 1 tablet (2.5 mg total) by mouth 2 (two) times daily. 04/24/23 05/24/23  Danton Lauraine LABOR, PA-C  calcium citrate-vitamin D  500-500 MG-UNIT chewable tablet Chew 500 mg by mouth daily.     [provider]  cetirizine (ZYRTEC) 10 MG tablet Take 10 mg by mouth every morning.     [provider]  Cholecalciferol (VITAMIN D ) 125 MCG (5000 UT) CAPS Take 5,000 Units by mouth daily.    [provider]  DULoxetine  (CYMBALTA ) 60 MG capsule Take 1 capsule (60 mg total) by mouth 2 (two) times daily. Patient taking differently: Take 120 mg by mouth every morning. 12/07/16   Fredia Julian, MD  EPINEPHrine  (EPIPEN  IJ) Inject 0.3 mg as directed 3  times/day as needed-between meals & bedtime (anaphylaxis).     [provider]  estradiol  (ESTRACE ) 0.1 MG/GM vaginal cream Estrogen Cream Instruction Discard applicator Apply pea sized amount to tip of finger to urethra before bed. Wash hands well after application. Use Monday, Wednesday and Friday 10/20/21   Francisca Redell BROCKS, MD  furosemide  (LASIX ) 40 MG tablet Take 40 mg by mouth daily.    [provider]  methocarbamol  (ROBAXIN ) 500 MG tablet Take 1 tablet (500 mg total) by mouth 4 (four) times daily. 04/24/23   Danton Lauraine LABOR, PA-C  methylphenidate  (RITALIN ) 10 MG tablet Take 10 mg by mouth every morning.    [provider]  Multiple Vitamins-Minerals (BARIATRIC MULTIVITAMINS/IRON PO) Take 1 tablet by mouth daily.    [provider]  oxyCODONE  10 MG TABS Take 1 tablet (10 mg total) by mouth every 4 (four) hours as needed for severe pain. 04/24/23   Danton Lauraine LABOR, PA-C  spironolactone  (ALDACTONE ) 25 MG tablet Take 25 mg by mouth daily. 10/18/22   [provider]  traZODone  (DESYREL ) 100 MG tablet Take 2 tablets (200 mg total) by mouth at bedtime as needed for sleep. 12/07/16   Fredia Julian, MD      Allergies    Tea, Egg-derived products, Lactose intolerance (gi), and Tape    Review  of Systems   Review of Systems  Constitutional:  Negative for chills and fever.  Respiratory:  Negative for shortness of breath.   Musculoskeletal:  Positive for arthralgias, back pain and joint swelling.  All other systems reviewed and are negative.   Physical Exam Updated Vital Signs BP (!) 147/82 (BP Location: Left Arm)   Pulse 82   Temp 97.9 F (36.6 C) (Oral)   Resp 20   Ht 5' 6 (1.676 m)   Wt 73.5 kg   LMP 03/09/2016   SpO2 99%   BMI 26.15 kg/m  Physical Exam Vitals and nursing note reviewed.  Constitutional:      General: She is not in acute distress.    Appearance: Normal appearance. She is not ill-appearing.  HENT:     Head:  Normocephalic and atraumatic.     Nose: Nose normal.  Eyes:     Conjunctiva/sclera: Conjunctivae normal.  Cardiovascular:     Rate and Rhythm: Normal rate and regular rhythm.  Pulmonary:     Effort: Pulmonary effort is normal. No respiratory distress.     Breath sounds: Normal breath sounds. No wheezing.  Abdominal:     General: There is no distension.     Palpations: Abdomen is soft.     Tenderness: There is no abdominal tenderness. There is no guarding.  Musculoskeletal:        General: No deformity.     Comments: Left knee held in flexion.  Significant tenderness palpation.  Also tender over the left ankle, left tib-fib.  Refuses to range the left knee due to concern of pain.  Skin:    Findings: No rash.  Neurological:     Mental Status: She is alert.     ED Results / Procedures / Treatments   Labs (all labs ordered are listed, but only abnormal results are displayed) Labs Reviewed  I-STAT CHEM 8, ED    EKG None  Radiology No results found.  Procedures Procedures    Medications Ordered in ED Medications  fentaNYL  (SUBLIMAZE ) injection 50 mcg (has no administration in time range)  ondansetron  (ZOFRAN ) injection 4 mg (has no administration in time range)    ED Course/ Medical Decision Making/ A&P Clinical Course as of 01/04/24 1546  Fri Jan 04, 2024  1513 Stable 28F on ladder painting fel from 4 feet height, unclear LOC. Awaiting for CT scans.   Ozell Purchase with ortho, will see her. Add on CT left knee. Hardware in left knee surgery in April 2024 with dr haddox.    [JL]    Clinical Course User Index [JL] Gaetano Pac, MD                                 Medical Decision Making Amount and/or Complexity of Data Reviewed Radiology: ordered.  Risk Prescription drug management. Decision regarding hospitalization.   58 year old female presents today for concern of fall off of a ladder at a 4 feet height.  Biggest concern for her is her left knee.   She refuses to move his on exam due to amount of pain.  Significant tenderness to palpation.  Also has a c-collar in place.  She is unsure if she lost consciousness.  X-rays of left ankle, left tib-fib, left knee, and pelvis obtained.  Left knee and tib-fib x-rays demonstrate a distal femur fracture that extends to the intercondylar notch.  Discussed with Ozell Purchase of orthopedics.  Recommends  adding on a CT scan of the knee.  CT head, CT cervical spine, thoracic spine, lumbar spine ordered.  Patient was complaining of generalized pain of her back and was requesting imaging so T-spine and L-spine were ordered.  After discussion with orthopedics CT of the left knee was added on.  Patient signed out to oncoming provider pending CT scans.   Final Clinical Impression(s) / ED Diagnoses Final diagnoses:  Fall, initial encounter  Closed fracture of distal end of left femur, unspecified fracture morphology, initial encounter [facility])    Rx / DC Orders ED Discharge Orders     None         Hildegard Loge, PA-C 01/05/24 1509    Freddi Hamilton, MD 01/08/24 1504

## 2024-01-04 NOTE — ED Triage Notes (Signed)
 Patient arrives via [facility] after a fall from a ladder while painting. Patient was on the second step from the top which she estimates to be 4 ft off the ground. Patient fell off ladder complaining of left knee and neck pain, rated 9/10. Patient is unaware if she lost consciousness, arrives in a c-collar. Patient endorses feeling dizzy before falling. Patient had surgery on knee last April recovering well prior to fall. PNS intact. Swelling increased but patient endorses constant swelling. Given 200mcg of fentanyl  and 4mg  zofran  en route, 18 RAC. Patient arrives alert and oriented x4.   EMS vitals BP 136/66 HR 82 O2 100 on room air CBG 126

## 2024-01-04 NOTE — Consult Note (Signed)
 Reason for Consult:Left distal femur fx Referring Physician: Glendia Breeding Time called: 1449 Time at bedside: 1525   Melinda Harrison is an 58 y.o. female.  HPI: Melinda Harrison was up a ladder painting and fell about 4 feet. She had immediate pain in her left knee and neck. She was brought to the ED where workup showed a left distal femur fx and orthopedic surgery was consulted.   Past Medical History:  Diagnosis Date   Anemia    H/O   Anxiety    Arthritis    Asthma    Back pain    COPD (chronic obstructive pulmonary disease) (HCC)    Depression    GERD (gastroesophageal reflux disease)    H/O PRIOR TO GASTRIC BYPASS   Headache    History of DVT (deep vein thrombosis)    Occurred in 2017   History of hiatal hernia    H/O   History of kidney stones    H/O   Hypertension    H/O -NO MEDS SINCE HAVING GASTRIC BYPASS   IBS (irritable bowel syndrome)    PAD (peripheral artery disease) (HCC)    PTSD (post-traumatic stress disorder)    PTSD (post-traumatic stress disorder)     Past Surgical History:  Procedure Laterality Date   CHOLECYSTECTOMY     DILATION AND CURETTAGE OF UTERUS     GASTRIC ROUX-EN-Y N/A 11/23/2015   Procedure: LAPAROSCOPIC ROUX-EN-Y GASTRIC BYPASS, HIATAL HERNIA REPAIR;  Surgeon: Ozell DELENA Mano, MD;  Location: [facility] ORS;  Service: General;  Laterality: N/A;   MENISCUS REPAIR Left 04/23/2023   Procedure: REPAIR OF MENISCUS;  Surgeon: Kendal Franky SQUIBB, MD;  Location: [facility] OR;  Service: Orthopedics;  Laterality: Left;   NECK SURGERY     ORIF TIBIA PLATEAU Left 04/23/2023   Procedure: OPEN REDUCTION INTERNAL FIXATION (ORIF) TIBIAL PLATEAU;  Surgeon: Kendal Franky SQUIBB, MD;  Location: [facility] OR;  Service: Orthopedics;  Laterality: Left;   TONSILLECTOMY      Family History  Problem Relation Age of Onset   Hypertension Mother    Thyroid disease Mother    Heart disease Mother    Heart failure Mother    Deep vein thrombosis Father    Hypertension Father    Depression Brother     Hypertension Brother    Depression Brother    Hypertension Brother     Social History:  reports that she has never smoked. She has never used smokeless tobacco. She reports current alcohol use. She reports that she does not use drugs.  Allergies:  Allergies  Allergen Reactions   Tea Anaphylaxis   Egg-Derived Products Swelling and Other (See Comments)   Lactose Intolerance (Gi)    Tape Rash    PAPER TAPE OK TO USE    Medications: I have reviewed the patient's current medications.  Results for orders placed or performed during the hospital encounter of 01/04/24 (from the past 48 hours)  I-stat chem 8, ED (not at [facility], [facility] or [facility])     Status: Abnormal   Collection Time: 01/04/24  1:55 PM  Result Value Ref Range   Sodium 137 135 - 145 mmol/L   Potassium 3.4 (L) 3.5 - 5.1 mmol/L   Chloride 99 98 - 111 mmol/L   BUN 23 (H) 6 - 20 mg/dL   Creatinine, Ser 9.09 0.44 - 1.00 mg/dL   Glucose, Bld 891 (H) 70 - 99 mg/dL    Comment: Glucose reference range applies only to samples taken after fasting for at  least 8 hours.   Calcium, Ion 1.09 (L) 1.15 - 1.40 mmol/L   TCO2 28 22 - 32 mmol/L   Hemoglobin 12.6 12.0 - 15.0 g/dL   HCT 62.9 63.9 - 53.9 %    CT Thoracic Spine Wo Contrast Result Date: 01/04/2024 CLINICAL DATA:  Mid-back pain; Low back pain, trauma EXAM: CT THORACIC AND LUMBAR SPINE WITHOUT CONTRAST TECHNIQUE: Multidetector CT imaging of the thoracic and lumbar spine was performed without intravenous contrast. Multiplanar CT image reconstructions were also generated. RADIATION DOSE REDUCTION: This exam was performed according to the departmental dose-optimization program which includes automated exposure control, adjustment of the mA and/or kV according to patient size and/or use of iterative reconstruction technique. COMPARISON:  CT AP 08/27/19 FINDINGS: CT THORACIC SPINE FINDINGS Alignment: Normal. Vertebrae: No acute fracture or focal pathologic process. Mild chronic appearing height  loss at the superior endplates of T1-T4. Unchanged mild superior endplate compression deformity at T12 compared to 2020 Paraspinal and other soft tissues: 5 mm subpleural nodule in the right lower lobe (series 7, image 73). Disc levels: No CT evidence of high-grade spinal canal stenosis. CT LUMBAR SPINE FINDINGS Segmentation: 5 lumbar type vertebrae. Alignment: Grade 1 anterolisthesis of L4 on L5 Vertebrae: No acute fracture or focal pathologic process. Paraspinal and other soft tissues: Postsurgical changes from prior gastric bypass surgery. Status post cholecystectomy. Diverticulosis without evidence of diverticulitis. Disc levels: No CT evidence of high-grade spinal canal stenosis. IMPRESSION: 1. No acute fracture or traumatic listhesis in the thoracic or lumbar spine. 2. 5 mm subpleural nodule in the right lower lobe. If the patient is high risk for lung cancer a follow up CT in 12 months could be obtained to ensure stability. Electronically Signed   By: Lyndall Gore M.D.   On: 01/04/2024 15:24   CT Lumbar Spine Wo Contrast Result Date: 01/04/2024 CLINICAL DATA:  Mid-back pain; Low back pain, trauma EXAM: CT THORACIC AND LUMBAR SPINE WITHOUT CONTRAST TECHNIQUE: Multidetector CT imaging of the thoracic and lumbar spine was performed without intravenous contrast. Multiplanar CT image reconstructions were also generated. RADIATION DOSE REDUCTION: This exam was performed according to the departmental dose-optimization program which includes automated exposure control, adjustment of the mA and/or kV according to patient size and/or use of iterative reconstruction technique. COMPARISON:  CT AP 08/27/19 FINDINGS: CT THORACIC SPINE FINDINGS Alignment: Normal. Vertebrae: No acute fracture or focal pathologic process. Mild chronic appearing height loss at the superior endplates of T1-T4. Unchanged mild superior endplate compression deformity at T12 compared to 2020 Paraspinal and other soft tissues: 5 mm subpleural  nodule in the right lower lobe (series 7, image 73). Disc levels: No CT evidence of high-grade spinal canal stenosis. CT LUMBAR SPINE FINDINGS Segmentation: 5 lumbar type vertebrae. Alignment: Grade 1 anterolisthesis of L4 on L5 Vertebrae: No acute fracture or focal pathologic process. Paraspinal and other soft tissues: Postsurgical changes from prior gastric bypass surgery. Status post cholecystectomy. Diverticulosis without evidence of diverticulitis. Disc levels: No CT evidence of high-grade spinal canal stenosis. IMPRESSION: 1. No acute fracture or traumatic listhesis in the thoracic or lumbar spine. 2. 5 mm subpleural nodule in the right lower lobe. If the patient is high risk for lung cancer a follow up CT in 12 months could be obtained to ensure stability. Electronically Signed   By: Lyndall Gore M.D.   On: 01/04/2024 15:24   CT Head Wo Contrast Result Date: 01/04/2024 CLINICAL DATA:  Trauma, fall from ladder neck pain EXAM: CT HEAD WITHOUT CONTRAST CT  CERVICAL SPINE WITHOUT CONTRAST TECHNIQUE: Multidetector CT imaging of the head and cervical spine was performed following the standard protocol without intravenous contrast. Multiplanar CT image reconstructions of the cervical spine were also generated. RADIATION DOSE REDUCTION: This exam was performed according to the departmental dose-optimization program which includes automated exposure control, adjustment of the mA and/or kV according to patient size and/or use of iterative reconstruction technique. COMPARISON:  None Available. FINDINGS: CT HEAD FINDINGS Brain: No evidence of acute infarction, hemorrhage, hydrocephalus, extra-axial collection or mass lesion/mass effect. Vascular: No hyperdense vessel or unexpected calcification. Skull: Normal. Negative for fracture or focal lesion. Sinuses/Orbits: No acute finding. Other: None. CT CERVICAL SPINE FINDINGS Alignment: Normal. Skull base and vertebrae: No acute fracture. No primary bone lesion or focal  pathologic process. Soft tissues and spinal canal: No prevertebral fluid or swelling. No visible canal hematoma. Disc levels: Moderate disc space height loss and osteophytosis of the lower cervical levels, worst from C5-C7. Upper chest: Negative. Other: None. IMPRESSION: 1. No acute intracranial pathology. 2. No fracture or static subluxation of the cervical spine. 3. Moderate disc space height loss and osteophytosis of the lower cervical levels, worst from C5-C7. Electronically Signed   By: Marolyn JONETTA Jaksch M.D.   On: 01/04/2024 15:17   CT Cervical Spine Wo Contrast Result Date: 01/04/2024 CLINICAL DATA:  Trauma, fall from ladder neck pain EXAM: CT HEAD WITHOUT CONTRAST CT CERVICAL SPINE WITHOUT CONTRAST TECHNIQUE: Multidetector CT imaging of the head and cervical spine was performed following the standard protocol without intravenous contrast. Multiplanar CT image reconstructions of the cervical spine were also generated. RADIATION DOSE REDUCTION: This exam was performed according to the departmental dose-optimization program which includes automated exposure control, adjustment of the mA and/or kV according to patient size and/or use of iterative reconstruction technique. COMPARISON:  None Available. FINDINGS: CT HEAD FINDINGS Brain: No evidence of acute infarction, hemorrhage, hydrocephalus, extra-axial collection or mass lesion/mass effect. Vascular: No hyperdense vessel or unexpected calcification. Skull: Normal. Negative for fracture or focal lesion. Sinuses/Orbits: No acute finding. Other: None. CT CERVICAL SPINE FINDINGS Alignment: Normal. Skull base and vertebrae: No acute fracture. No primary bone lesion or focal pathologic process. Soft tissues and spinal canal: No prevertebral fluid or swelling. No visible canal hematoma. Disc levels: Moderate disc space height loss and osteophytosis of the lower cervical levels, worst from C5-C7. Upper chest: Negative. Other: None. IMPRESSION: 1. No acute intracranial  pathology. 2. No fracture or static subluxation of the cervical spine. 3. Moderate disc space height loss and osteophytosis of the lower cervical levels, worst from C5-C7. Electronically Signed   By: Marolyn JONETTA Jaksch M.D.   On: 01/04/2024 15:17   DG Tibia/Fibula Left Result Date: 01/04/2024 CLINICAL DATA:  Clemens from ladder EXAM: LEFT TIBIA AND FIBULA - 2 VIEW COMPARISON:  None Available. FINDINGS: Minimally comminuted oblique fracture of the distal femur involving the lateral condyle, proximal margin not visualized. Stable orthopedic fixation hardware in the proximal tibia. Distal tibial shaft, and fibula are normal. IMPRESSION: Minimally comminuted oblique fracture of the distal femur involving the lateral condyle. Electronically Signed   By: JONETTA Faes M.D.   On: 01/04/2024 14:41   DG Ankle Complete Left Result Date: 01/04/2024 CLINICAL DATA:  Clemens from ladder EXAM: LEFT ANKLE COMPLETE - 3+ VIEW COMPARISON:  04/22/2023 FINDINGS: There is no evidence of fracture, dislocation, or joint effusion. There is no evidence of arthropathy or other focal bone abnormality. Soft tissues are unremarkable. IMPRESSION: Negative. Electronically Signed   By: JONETTA  Johann M.D.   On: 01/04/2024 14:40   DG Knee Complete 4 Views Left Result Date: 01/04/2024 CLINICAL DATA:  Clemens from ladder EXAM: LEFT KNEE - COMPLETE 4+ VIEW COMPARISON:  04/23/2023 and previous FINDINGS: Oblique fracture of the distal femoral shaft extending to the intercondylar notch, incompletely visualized proximally. Fat/fluid level in the suprapatellar bursa. Diffuse osteopenia. Previous orthopedic fixation of the tibial plateau, stable. IMPRESSION: 1. Oblique fracture of the distal femoral shaft extending to the intercondylar notch. 2. Fat/fluid level in the suprapatellar bursa. Electronically Signed   By: JONETTA Johann M.D.   On: 01/04/2024 14:38   DG Pelvis Portable Result Date: 01/04/2024 CLINICAL DATA:  Clemens from ladder EXAM: PORTABLE PELVIS 1-2 VIEWS  COMPARISON:  02/11/2014 FINDINGS: There is no evidence of pelvic fracture or diastasis. No pelvic bone lesions are seen. IMPRESSION: Negative. Electronically Signed   By: JONETTA Johann M.D.   On: 01/04/2024 14:36    Review of Systems  HENT:  Negative for ear discharge, ear pain, hearing loss and tinnitus.   Eyes:  Negative for photophobia and pain.  Respiratory:  Negative for cough and shortness of breath.   Cardiovascular:  Negative for chest pain.  Gastrointestinal:  Negative for abdominal pain, nausea and vomiting.  Genitourinary:  Negative for dysuria, flank pain, frequency and urgency.  Musculoskeletal:  Positive for arthralgias (Left knee) and neck pain. Negative for back pain and myalgias.  Neurological:  Negative for dizziness and headaches.  Hematological:  Does not bruise/bleed easily.  Psychiatric/Behavioral:  The patient is not nervous/anxious.    Blood pressure (!) 147/82, pulse 82, temperature 97.9 F (36.6 C), temperature source Oral, resp. rate 20, height 5' 6 (1.676 m), weight 73.5 kg, last menstrual period 03/09/2016, SpO2 99%. Physical Exam Constitutional:      General: She is not in acute distress.    Appearance: She is well-developed. She is not diaphoretic.  HENT:     Head: Normocephalic and atraumatic.  Eyes:     General: No scleral icterus.       Right eye: No discharge.        Left eye: No discharge.     Conjunctiva/sclera: Conjunctivae normal.  Cardiovascular:     Rate and Rhythm: Normal rate and regular rhythm.  Pulmonary:     Effort: Pulmonary effort is normal. No respiratory distress.  Musculoskeletal:     Cervical back: Normal range of motion.     Comments: LLE No traumatic wounds, ecchymosis, or rash  Severe TTP knee  No ankle effusion  Sens DPN, SPN, TN intact  Motor EHL, ext, flex, evers 5/5  DP 2+, PT 2+, No significant edema  Skin:    General: Skin is warm and dry.  Neurological:     Mental Status: She is alert.  Psychiatric:        Mood  and Affect: Mood normal.        Behavior: Behavior normal.     Assessment/Plan: Left distal femur fx -- Plan ORIF Monday with Dr. Kendal. NPO after MN Sunday.    Ozell DOROTHA Ned, PA-C Orthopedic Surgery [phone] 01/04/2024, 3:33 PM

## 2024-01-04 NOTE — ED Notes (Signed)
 ED TO INPATIENT HANDOFF REPORT  ED Nurse Name and Phone #: Chiquita [phone]  S Name/Age/Gender Devere GORMAN Hoar 58 y.o. female Room/Bed: H016C/H016C  Code Status   Code Status: Full Code  Home/SNF/Other Home Patient oriented to: self, place, time, and situation Is this baseline? Yes   Triage Complete: Triage complete  Chief Complaint Closed fracture of left femur [facility]) [S72.92XA]  Triage Note Patient arrives via [facility] after a fall from a ladder while painting. Patient was on the second step from the top which she estimates to be 4 ft off the ground. Patient fell off ladder complaining of left knee and neck pain, rated 9/10. Patient is unaware if she lost consciousness, arrives in a c-collar. Patient endorses feeling dizzy before falling. Patient had surgery on knee last April recovering well prior to fall. PNS intact. Swelling increased but patient endorses constant swelling. Given 200mcg of fentanyl  and 4mg  zofran  en route, 18 RAC. Patient arrives alert and oriented x4.   EMS vitals BP 136/66 HR 82 O2 100 on room air CBG 126   Allergies Allergies  Allergen Reactions   Tea Anaphylaxis   Egg-Derived Products Swelling and Other (See Comments)   Lactose Intolerance (Gi)    Tape Rash    PAPER TAPE OK TO USE    Level of Care/Admitting Diagnosis ED Disposition     ED Disposition  Admit   Condition  --   Comment  Hospital Area: [facility] [[id number]]  Level of Care: Med-Surg [16]  May admit patient to [facility] or [facility] if equivalent level of care is available:: No  Covid Evaluation: Asymptomatic - no recent exposure (last 10 days) testing not required  Diagnosis: Closed fracture of left femur [facility]) [[id number]]  Admitting Physician: HADDIX, KEVIN P [[id number]]  Attending Physician: HADDIX, KEVIN P [[id number]]  Bed request comments: 5N  Certification:: I certify this patient will need inpatient services for at least 2 midnights  Expected Medical  Readiness: 01/08/2024          B Medical/Surgery History Past Medical History:  Diagnosis Date   Anemia    H/O   Anxiety    Arthritis    Asthma    Back pain    COPD (chronic obstructive pulmonary disease) (HCC)    Depression    GERD (gastroesophageal reflux disease)    H/O PRIOR TO GASTRIC BYPASS   Headache    History of DVT (deep vein thrombosis)    Occurred in 2017   History of hiatal hernia    H/O   History of kidney stones    H/O   Hypertension    H/O -NO MEDS SINCE HAVING GASTRIC BYPASS   IBS (irritable bowel syndrome)    PAD (peripheral artery disease) (HCC)    PTSD (post-traumatic stress disorder)    PTSD (post-traumatic stress disorder)    Past Surgical History:  Procedure Laterality Date   CHOLECYSTECTOMY     DILATION AND CURETTAGE OF UTERUS     GASTRIC ROUX-EN-Y N/A 11/23/2015   Procedure: LAPAROSCOPIC ROUX-EN-Y GASTRIC BYPASS, HIATAL HERNIA REPAIR;  Surgeon: Ozell DELENA Mano, MD;  Location: [facility] ORS;  Service: General;  Laterality: N/A;   MENISCUS REPAIR Left 04/23/2023   Procedure: REPAIR OF MENISCUS;  Surgeon: Kendal Franky SQUIBB, MD;  Location: [facility] OR;  Service: Orthopedics;  Laterality: Left;   NECK SURGERY     ORIF TIBIA PLATEAU Left 04/23/2023   Procedure: OPEN REDUCTION INTERNAL FIXATION (ORIF) TIBIAL PLATEAU;  Surgeon: Kendal Franky  P, MD;  Location: [facility] OR;  Service: Orthopedics;  Laterality: Left;   TONSILLECTOMY       A IV Location/Drains/Wounds Patient Lines/Drains/Airways Status     Active Line/Drains/Airways     Name Placement date Placement time Site Days   Peripheral IV 01/04/24 18 G Right Antecubital 01/04/24  1230  Antecubital  less than 1            Intake/Output Last 24 hours No intake or output data in the 24 hours ending 01/04/24 1627  Labs/Imaging Results for orders placed or performed during the hospital encounter of 01/04/24 (from the past 48 hours)  I-stat chem 8, ED (not at [facility], [facility] or [facility])     Status: Abnormal    Collection Time: 01/04/24  1:55 PM  Result Value Ref Range   Sodium 137 135 - 145 mmol/L   Potassium 3.4 (L) 3.5 - 5.1 mmol/L   Chloride 99 98 - 111 mmol/L   BUN 23 (H) 6 - 20 mg/dL   Creatinine, Ser 9.09 0.44 - 1.00 mg/dL   Glucose, Bld 891 (H) 70 - 99 mg/dL    Comment: Glucose reference range applies only to samples taken after fasting for at least 8 hours.   Calcium, Ion 1.09 (L) 1.15 - 1.40 mmol/L   TCO2 28 22 - 32 mmol/L   Hemoglobin 12.6 12.0 - 15.0 g/dL   HCT 62.9 63.9 - 53.9 %   CT Knee Left Wo Contrast Result Date: 01/04/2024 CLINICAL DATA:  Knee fracture EXAM: CT OF THE LEFT KNEE WITHOUT CONTRAST TECHNIQUE: Multidetector CT imaging of the left knee was performed according to the standard protocol. Multiplanar CT image reconstructions were also generated. RADIATION DOSE REDUCTION: This exam was performed according to the departmental dose-optimization program which includes automated exposure control, adjustment of the mA and/or kV according to patient size and/or use of iterative reconstruction technique. COMPARISON:  Radiographs 10/03/2024 FINDINGS: Bones/Joint/Cartilage One jejunal fracture of the distal femur extending from the anteromedial diaphysis about 20 cm proximal to the joint line, and extending obliquely into the lateral femoral trochlear groove and along the lateral side of the intercondylar notch. Distal comminution with a transverse component along the original physeal plane extending out to the lateral margin of the lateral femoral condyle, with an additional minor lateral fragment measuring about 3.8 by 3.3 by 0.8 cm. Fracture type OTA 33C1 with associated longitudinal shaft fracture and with the transverse fracture component limited to the lateral condyle. Tibial plate and screw fixator noted. No proximal fibular fracture observed. As expected there is a lipohemarthrosis in the joint and suprapatellar bursa. Ligaments Suboptimally assessed by CT. Muscles and Tendons  Unremarkable Soft tissues Infiltrative hematoma in the popliteal space and extending cephalad, partially around the distal sciatic nerve. IMPRESSION: 1. Comminuted longitudinal fracture of the distal femur extends into the femoral trochlear groove and lateral femoral notch. Transverse component about at the level of the original fused growth plate extends to the lateral condylar margin where there is also mild fragmentation. Fracture type OTA 33C1 with associated longitudinal shaft fracture and with the transverse fracture component limited to the lateral condyle. 2. Lipohemarthrosis in the joint and suprapatellar bursa. 3. Infiltrative hematoma in the popliteal space and extending cephalad, partially around the distal sciatic nerve. Electronically Signed   By: Ryan Salvage M.D.   On: 01/04/2024 15:39   CT Thoracic Spine Wo Contrast Result Date: 01/04/2024 CLINICAL DATA:  Mid-back pain; Low back pain, trauma EXAM: CT THORACIC AND LUMBAR SPINE  WITHOUT CONTRAST TECHNIQUE: Multidetector CT imaging of the thoracic and lumbar spine was performed without intravenous contrast. Multiplanar CT image reconstructions were also generated. RADIATION DOSE REDUCTION: This exam was performed according to the departmental dose-optimization program which includes automated exposure control, adjustment of the mA and/or kV according to patient size and/or use of iterative reconstruction technique. COMPARISON:  CT AP 08/27/19 FINDINGS: CT THORACIC SPINE FINDINGS Alignment: Normal. Vertebrae: No acute fracture or focal pathologic process. Mild chronic appearing height loss at the superior endplates of T1-T4. Unchanged mild superior endplate compression deformity at T12 compared to 2020 Paraspinal and other soft tissues: 5 mm subpleural nodule in the right lower lobe (series 7, image 73). Disc levels: No CT evidence of high-grade spinal canal stenosis. CT LUMBAR SPINE FINDINGS Segmentation: 5 lumbar type vertebrae. Alignment: Grade  1 anterolisthesis of L4 on L5 Vertebrae: No acute fracture or focal pathologic process. Paraspinal and other soft tissues: Postsurgical changes from prior gastric bypass surgery. Status post cholecystectomy. Diverticulosis without evidence of diverticulitis. Disc levels: No CT evidence of high-grade spinal canal stenosis. IMPRESSION: 1. No acute fracture or traumatic listhesis in the thoracic or lumbar spine. 2. 5 mm subpleural nodule in the right lower lobe. If the patient is high risk for lung cancer a follow up CT in 12 months could be obtained to ensure stability. Electronically Signed   By: Lyndall Gore M.D.   On: 01/04/2024 15:24   CT Lumbar Spine Wo Contrast Result Date: 01/04/2024 CLINICAL DATA:  Mid-back pain; Low back pain, trauma EXAM: CT THORACIC AND LUMBAR SPINE WITHOUT CONTRAST TECHNIQUE: Multidetector CT imaging of the thoracic and lumbar spine was performed without intravenous contrast. Multiplanar CT image reconstructions were also generated. RADIATION DOSE REDUCTION: This exam was performed according to the departmental dose-optimization program which includes automated exposure control, adjustment of the mA and/or kV according to patient size and/or use of iterative reconstruction technique. COMPARISON:  CT AP 08/27/19 FINDINGS: CT THORACIC SPINE FINDINGS Alignment: Normal. Vertebrae: No acute fracture or focal pathologic process. Mild chronic appearing height loss at the superior endplates of T1-T4. Unchanged mild superior endplate compression deformity at T12 compared to 2020 Paraspinal and other soft tissues: 5 mm subpleural nodule in the right lower lobe (series 7, image 73). Disc levels: No CT evidence of high-grade spinal canal stenosis. CT LUMBAR SPINE FINDINGS Segmentation: 5 lumbar type vertebrae. Alignment: Grade 1 anterolisthesis of L4 on L5 Vertebrae: No acute fracture or focal pathologic process. Paraspinal and other soft tissues: Postsurgical changes from prior gastric bypass  surgery. Status post cholecystectomy. Diverticulosis without evidence of diverticulitis. Disc levels: No CT evidence of high-grade spinal canal stenosis. IMPRESSION: 1. No acute fracture or traumatic listhesis in the thoracic or lumbar spine. 2. 5 mm subpleural nodule in the right lower lobe. If the patient is high risk for lung cancer a follow up CT in 12 months could be obtained to ensure stability. Electronically Signed   By: Lyndall Gore M.D.   On: 01/04/2024 15:24   CT Head Wo Contrast Result Date: 01/04/2024 CLINICAL DATA:  Trauma, fall from ladder neck pain EXAM: CT HEAD WITHOUT CONTRAST CT CERVICAL SPINE WITHOUT CONTRAST TECHNIQUE: Multidetector CT imaging of the head and cervical spine was performed following the standard protocol without intravenous contrast. Multiplanar CT image reconstructions of the cervical spine were also generated. RADIATION DOSE REDUCTION: This exam was performed according to the departmental dose-optimization program which includes automated exposure control, adjustment of the mA and/or kV according to patient size and/or  use of iterative reconstruction technique. COMPARISON:  None Available. FINDINGS: CT HEAD FINDINGS Brain: No evidence of acute infarction, hemorrhage, hydrocephalus, extra-axial collection or mass lesion/mass effect. Vascular: No hyperdense vessel or unexpected calcification. Skull: Normal. Negative for fracture or focal lesion. Sinuses/Orbits: No acute finding. Other: None. CT CERVICAL SPINE FINDINGS Alignment: Normal. Skull base and vertebrae: No acute fracture. No primary bone lesion or focal pathologic process. Soft tissues and spinal canal: No prevertebral fluid or swelling. No visible canal hematoma. Disc levels: Moderate disc space height loss and osteophytosis of the lower cervical levels, worst from C5-C7. Upper chest: Negative. Other: None. IMPRESSION: 1. No acute intracranial pathology. 2. No fracture or static subluxation of the cervical spine. 3.  Moderate disc space height loss and osteophytosis of the lower cervical levels, worst from C5-C7. Electronically Signed   By: Marolyn JONETTA Jaksch M.D.   On: 01/04/2024 15:17   CT Cervical Spine Wo Contrast Result Date: 01/04/2024 CLINICAL DATA:  Trauma, fall from ladder neck pain EXAM: CT HEAD WITHOUT CONTRAST CT CERVICAL SPINE WITHOUT CONTRAST TECHNIQUE: Multidetector CT imaging of the head and cervical spine was performed following the standard protocol without intravenous contrast. Multiplanar CT image reconstructions of the cervical spine were also generated. RADIATION DOSE REDUCTION: This exam was performed according to the departmental dose-optimization program which includes automated exposure control, adjustment of the mA and/or kV according to patient size and/or use of iterative reconstruction technique. COMPARISON:  None Available. FINDINGS: CT HEAD FINDINGS Brain: No evidence of acute infarction, hemorrhage, hydrocephalus, extra-axial collection or mass lesion/mass effect. Vascular: No hyperdense vessel or unexpected calcification. Skull: Normal. Negative for fracture or focal lesion. Sinuses/Orbits: No acute finding. Other: None. CT CERVICAL SPINE FINDINGS Alignment: Normal. Skull base and vertebrae: No acute fracture. No primary bone lesion or focal pathologic process. Soft tissues and spinal canal: No prevertebral fluid or swelling. No visible canal hematoma. Disc levels: Moderate disc space height loss and osteophytosis of the lower cervical levels, worst from C5-C7. Upper chest: Negative. Other: None. IMPRESSION: 1. No acute intracranial pathology. 2. No fracture or static subluxation of the cervical spine. 3. Moderate disc space height loss and osteophytosis of the lower cervical levels, worst from C5-C7. Electronically Signed   By: Marolyn JONETTA Jaksch M.D.   On: 01/04/2024 15:17   DG Tibia/Fibula Left Result Date: 01/04/2024 CLINICAL DATA:  Clemens from ladder EXAM: LEFT TIBIA AND FIBULA - 2 VIEW  COMPARISON:  None Available. FINDINGS: Minimally comminuted oblique fracture of the distal femur involving the lateral condyle, proximal margin not visualized. Stable orthopedic fixation hardware in the proximal tibia. Distal tibial shaft, and fibula are normal. IMPRESSION: Minimally comminuted oblique fracture of the distal femur involving the lateral condyle. Electronically Signed   By: JONETTA Faes M.D.   On: 01/04/2024 14:41   DG Ankle Complete Left Result Date: 01/04/2024 CLINICAL DATA:  Clemens from ladder EXAM: LEFT ANKLE COMPLETE - 3+ VIEW COMPARISON:  04/22/2023 FINDINGS: There is no evidence of fracture, dislocation, or joint effusion. There is no evidence of arthropathy or other focal bone abnormality. Soft tissues are unremarkable. IMPRESSION: Negative. Electronically Signed   By: JONETTA Faes M.D.   On: 01/04/2024 14:40   DG Knee Complete 4 Views Left Result Date: 01/04/2024 CLINICAL DATA:  Clemens from ladder EXAM: LEFT KNEE - COMPLETE 4+ VIEW COMPARISON:  04/23/2023 and previous FINDINGS: Oblique fracture of the distal femoral shaft extending to the intercondylar notch, incompletely visualized proximally. Fat/fluid level in the suprapatellar bursa. Diffuse osteopenia. Previous orthopedic fixation  of the tibial plateau, stable. IMPRESSION: 1. Oblique fracture of the distal femoral shaft extending to the intercondylar notch. 2. Fat/fluid level in the suprapatellar bursa. Electronically Signed   By: JONETTA Faes M.D.   On: 01/04/2024 14:38   DG Pelvis Portable Result Date: 01/04/2024 CLINICAL DATA:  Clemens from ladder EXAM: PORTABLE PELVIS 1-2 VIEWS COMPARISON:  02/11/2014 FINDINGS: There is no evidence of pelvic fracture or diastasis. No pelvic bone lesions are seen. IMPRESSION: Negative. Electronically Signed   By: JONETTA Faes M.D.   On: 01/04/2024 14:36    Pending Labs [employer] (From admission, onward)     Start     Ordered   Signed and Held  Creatinine, serum  (enoxaparin  (LOVENOX )    CrCl  >/= 30 ml/min)  Weekly,   R     Comments: while on enoxaparin  therapy    Signed and Held            Vitals/Pain Today's Vitals   01/04/24 1324 01/04/24 1329  BP:  (!) 147/82  Pulse:  82  Resp:  20  Temp:  97.9 F (36.6 C)  TempSrc:  Oral  SpO2:  99%  Weight: 73.5 kg   Height: 5' 6 (1.676 m)   PainSc: 9      Isolation Precautions No active isolations  Medications Medications  fentaNYL  (SUBLIMAZE ) injection 50 mcg (50 mcg Intravenous Given 01/04/24 1356)  ondansetron  (ZOFRAN ) injection 4 mg (4 mg Intravenous Given 01/04/24 1355)  fentaNYL  (SUBLIMAZE ) injection 75 mcg (75 mcg Intravenous Given 01/04/24 1515)  HYDROmorphone  (DILAUDID ) injection 0.5 mg (0.5 mg Intravenous Given 01/04/24 1611)  lactated ringers  bolus 500 mL (500 mLs Intravenous New Bag/Given 01/04/24 1625)    Mobility walks     Focused Assessments     R Recommendations: See Admitting Provider Note  Report given to:   Additional Notes:

## 2024-01-04 NOTE — ED Provider Notes (Signed)
 Patient care assumed from previous provider.   Patient care of Charae Depaolis is a 58 y.o. female from previous provider. Please see the original provider note from this emergency department encounter for full history and physical.   Course of Care and my assessment at the time of sign out is detailed in the ED Course below.   Clinical Course as of 01/04/24 2035  Fri Jan 04, 2024  1513 Stable 59F on ladder painting fel from 4 feet height, unclear LOC. Awaiting for CT scans.   Ozell Purchase with ortho, will see her. Add on CT left knee. Hardware in left knee surgery in April 2024 with dr haddox.    [JL]    Clinical Course User Index [JL] Gaetano Pac, MD   I received care of this patient, I added on a CT chest abdomen pelvis, for completion trauma scans given the very high mechanism fell from a 4 to 5 feet off a ladder with significant injury and eye trauma.  Ultimately this showed no acute traumatic injuries fortunately, however there was a very small right lower lobe nodule, I specifically counseled the patient and her husband on this at bedside, they are going to follow-up with her doctor regarding this, orthopedics will be admitting this patient at this time   Gaetano Pac, MD 01/04/24 2035    Freddi Hamilton, MD 01/30/24 [phone]

## 2024-01-05 ENCOUNTER — Encounter (HOSPITAL_COMMUNITY): Payer: Self-pay | Admitting: Student

## 2024-01-05 NOTE — TOC CAGE-AID Note (Signed)
 Transition of Care [facility]) - CAGE-AID Screening   Patient Details  Name: Melinda Harrison MRN: [id number] Date of Birth: Jun 07, 1966  Transition of Care [facility]) CM/SW Contact:    LEBRON ROCKIE ORN, RN Phone Number: [phone] 01/05/2024, 4:00 PM   Clinical Narrative: Pt in hospital after sustaining a left femur fracture due to falling off of a step-ladder.  Pt denies drug use and only occasionally drinks alcohol.  Resources not needed.  Screening complete.   CAGE-AID Screening:    Have You Ever Felt You Ought to Cut Down on Your Drinking or Drug Use?: No Have People Annoyed You By Critizing Your Drinking Or Drug Use?: No Have You Felt Bad Or Guilty About Your Drinking Or Drug Use?: No Have You Ever Had a Drink or Used Drugs First Thing In The Morning to Steady Your Nerves or to Get Rid of a Hangover?: No CAGE-AID Score: 0  Substance Abuse Education Offered: No

## 2024-01-05 NOTE — Progress Notes (Signed)
     Subjective:  Patient reports pain as moderate.  Doing ok resting in bed.  Objective:   VITALS:   Vitals:   01/04/24 1803 01/04/24 1840 01/04/24 2045 01/05/24 0620  BP: 136/72 123/61 129/71 117/60  Pulse: 91 87 (!) 105 87  Resp: 19 14 14 16   Temp: 98.2 F (36.8 C) 97.6 F (36.4 C) 98.1 F (36.7 C) (!) 97.4 F (36.3 C)  TempSrc: Oral Oral Oral Oral  SpO2:  100% 98% 99%  Weight:      Height:        Neurologically intact Sensation intact distally Dorsiflexion/Plantar flexion intact   Lab Results  Component Value Date   WBC 8.9 04/25/2023   HGB 12.6 01/04/2024   HCT 37.0 01/04/2024   MCV 99.7 04/25/2023   PLT 184 04/25/2023   BMET    Component Value Date/Time   NA 137 01/04/2024 1355   NA 134 (L) 04/23/2015 1551   K 3.4 (L) 01/04/2024 1355   K 3.1 (L) 04/23/2015 1551   CL 99 01/04/2024 1355   CL 97 (L) 04/23/2015 1551   CO2 27 04/24/2023 0419   CO2 26 04/23/2015 1551   GLUCOSE 108 (H) 01/04/2024 1355   GLUCOSE 122 (H) 04/23/2015 1551   BUN 23 (H) 01/04/2024 1355   BUN 17 04/23/2015 1551   CREATININE 0.90 01/04/2024 1355   CREATININE 0.89 04/23/2015 1551   CALCIUM 8.5 (L) 04/24/2023 0419   CALCIUM 8.9 04/23/2015 1551   GFRNONAA >60 04/24/2023 0419   GFRNONAA >60 04/23/2015 1551     Assessment/Plan:     Principal Problem:   Closed fracture of left femur [facility])  Surgery planned with Dr. Kendal on Monday.  NPO after midnight Sunday night.    Anticipated LOS equal to or greater than 2 midnights due to - Age 59 and older with one or more of the following:  - Obesity  - Expected need for hospital services (PT, OT, Nursing) required for safe  discharge  - Anticipated need for postoperative skilled nursing care or inpatient rehab  - Active co-morbidities: None   Fonda SHAUNNA Olmsted 01/05/2024, 7:59 AM   Fonda Olmsted, MD Cell [phone]

## 2024-01-06 MED ORDER — ACETAMINOPHEN 500 MG PO TABS
1000.0000 mg | ORAL_TABLET | Freq: Three times a day (TID) | ORAL | Status: DC
Start: 2024-01-06 — End: 2024-01-08
  Administered 2024-01-06 – 2024-01-08 (×5): 1000 mg via ORAL
  Filled 2024-01-06 (×5): qty 2

## 2024-01-06 NOTE — Progress Notes (Signed)
     Subjective:  Patient reports pain as moderate to severe. Oxycodone  helps more than Morphine . Requests purewick.   Objective:   VITALS:   Vitals:   01/05/24 1637 01/05/24 2028 01/06/24 0609 01/06/24 0839  BP: 118/75 128/74 117/71 120/60  Pulse: 97 (!) 102 99 (!) 103  Resp: 16  16   Temp: 98.2 F (36.8 C) 99.3 F (37.4 C) 98.7 F (37.1 C) 98.5 F (36.9 C)  TempSrc: Oral Oral Oral Oral  SpO2: 97% 98% 97% 97%  Weight:      Height:        Neurologically intact Sensation intact distally Dorsiflexion/Plantar flexion intact   Lab Results  Component Value Date   WBC 8.9 04/25/2023   HGB 12.6 01/04/2024   HCT 37.0 01/04/2024   MCV 99.7 04/25/2023   PLT 184 04/25/2023   BMET    Component Value Date/Time   NA 137 01/04/2024 1355   NA 134 (L) 04/23/2015 1551   K 3.4 (L) 01/04/2024 1355   K 3.1 (L) 04/23/2015 1551   CL 99 01/04/2024 1355   CL 97 (L) 04/23/2015 1551   CO2 27 04/24/2023 0419   CO2 26 04/23/2015 1551   GLUCOSE 108 (H) 01/04/2024 1355   GLUCOSE 122 (H) 04/23/2015 1551   BUN 23 (H) 01/04/2024 1355   BUN 17 04/23/2015 1551   CREATININE 0.90 01/04/2024 1355   CREATININE 0.89 04/23/2015 1551   CALCIUM 8.5 (L) 04/24/2023 0419   CALCIUM 8.9 04/23/2015 1551   GFRNONAA >60 04/24/2023 0419   GFRNONAA >60 04/23/2015 1551     Assessment/Plan:     Principal Problem:   Closed fracture of left femur [facility])  Surgery planned with Dr. Kendal on Monday.  NPO after midnight Sunday night.    Anticipated LOS equal to or greater than 2 midnights due to - Age 59 and older with one or more of the following:  - Obesity  - Expected need for hospital services (PT, OT, Nursing) required for safe  discharge  - Anticipated need for postoperative skilled nursing care or inpatient rehab  - Active co-morbidities: None   Aleck LOISE Stalling 01/06/2024, 10:55 AM

## 2024-01-06 NOTE — Plan of Care (Signed)

## 2024-01-06 NOTE — Plan of Care (Signed)

## 2024-01-07 ENCOUNTER — Inpatient Hospital Stay (HOSPITAL_COMMUNITY): Payer: Medicare HMO

## 2024-01-07 ENCOUNTER — Encounter (HOSPITAL_COMMUNITY)
Admission: EM | Disposition: A | Discharge status: Home Health | Payer: Self-pay | Source: Home / Self Care | Attending: Student

## 2024-01-07 ENCOUNTER — Inpatient Hospital Stay (HOSPITAL_COMMUNITY): Payer: Medicare HMO | Admitting: Anesthesiology

## 2024-01-07 ENCOUNTER — Encounter (HOSPITAL_COMMUNITY): Payer: Self-pay | Admitting: Student

## 2024-01-07 ENCOUNTER — Other Ambulatory Visit: Payer: Self-pay

## 2024-01-07 DIAGNOSIS — F418 Other specified anxiety disorders: Secondary | ICD-10-CM

## 2024-01-07 DIAGNOSIS — S72462A Displaced supracondylar fracture with intracondylar extension of lower end of left femur, initial encounter for closed fracture: Secondary | ICD-10-CM

## 2024-01-07 DIAGNOSIS — I10 Essential (primary) hypertension: Secondary | ICD-10-CM | POA: Diagnosis not present

## 2024-01-07 DIAGNOSIS — J449 Chronic obstructive pulmonary disease, unspecified: Secondary | ICD-10-CM | POA: Diagnosis not present

## 2024-01-07 HISTORY — PX: ORIF FEMUR FRACTURE: SHX2119

## 2024-01-07 SURGERY — OPEN REDUCTION INTERNAL FIXATION (ORIF) DISTAL FEMUR FRACTURE
Anesthesia: Regional | Site: Knee | Laterality: Left

## 2024-01-07 MED ORDER — METOCLOPRAMIDE HCL 5 MG/ML IJ SOLN: 5.0000 mg | Freq: Three times a day (TID) | INTRAMUSCULAR | Status: DC | PRN

## 2024-01-07 MED ORDER — METOCLOPRAMIDE HCL 5 MG PO TABS: 5.0000 mg | ORAL_TABLET | Freq: Three times a day (TID) | ORAL | Status: DC | PRN

## 2024-01-07 MED ORDER — OXYCODONE HCL 5 MG/5ML PO SOLN: 5.0000 mg | Freq: Once | ORAL | Status: AC | PRN

## 2024-01-07 MED ORDER — LIDOCAINE 2% (20 MG/ML) 5 ML SYRINGE
INTRAMUSCULAR | Status: DC | PRN
  Administered 2024-01-07: 40 mg via INTRAVENOUS

## 2024-01-07 MED ORDER — FENTANYL CITRATE (PF) 250 MCG/5ML IJ SOLN
INTRAMUSCULAR | Status: AC
  Filled 2024-01-07: qty 5

## 2024-01-07 MED ORDER — DEXAMETHASONE SODIUM PHOSPHATE 10 MG/ML IJ SOLN
INTRAMUSCULAR | Status: DC | PRN
  Administered 2024-01-07: 10 mg via INTRAVENOUS

## 2024-01-07 MED ORDER — CHLORHEXIDINE GLUCONATE 4 % EX SOLN
60.0000 mL | Freq: Once | CUTANEOUS | Status: DC
Start: 2024-01-07 — End: 2024-01-07

## 2024-01-07 MED ORDER — CEFAZOLIN SODIUM-DEXTROSE 2-4 GM/100ML-% IV SOLN
2.0000 g | INTRAVENOUS | Status: AC
  Administered 2024-01-07: 2 g via INTRAVENOUS
  Filled 2024-01-07: qty 100

## 2024-01-07 MED ORDER — MIDAZOLAM HCL 2 MG/2ML IJ SOLN
INTRAMUSCULAR | Status: DC | PRN
  Administered 2024-01-07 (×2): 1 mg via INTRAVENOUS

## 2024-01-07 MED ORDER — DIPHENHYDRAMINE HCL 12.5 MG/5ML PO ELIX: 12.5000 mg | ORAL_SOLUTION | ORAL | Status: DC | PRN

## 2024-01-07 MED ORDER — SODIUM CHLORIDE 0.9% FLUSH
3.0000 mL | Freq: Two times a day (BID) | INTRAVENOUS | Status: DC
  Administered 2024-01-08 – 2024-01-09 (×3): 10 mL via INTRAVENOUS
  Administered 2024-01-09 – 2024-01-10 (×2): 3 mL via INTRAVENOUS

## 2024-01-07 MED ORDER — ENOXAPARIN SODIUM 40 MG/0.4ML IJ SOSY
40.0000 mg | PREFILLED_SYRINGE | INTRAMUSCULAR | Status: DC
  Administered 2024-01-08: 40 mg via SUBCUTANEOUS
  Filled 2024-01-07: qty 0.4

## 2024-01-07 MED ORDER — POVIDONE-IODINE 10 % EX SWAB
2.0000 | Freq: Once | CUTANEOUS | Status: DC
Start: 2024-01-07 — End: 2024-01-07

## 2024-01-07 MED ORDER — CHLORHEXIDINE GLUCONATE 0.12 % MT SOLN
15.0000 mL | Freq: Once | OROMUCOSAL | Status: AC
  Administered 2024-01-07: 15 mL via OROMUCOSAL
  Filled 2024-01-07: qty 15

## 2024-01-07 MED ORDER — METHOCARBAMOL 1000 MG/10ML IJ SOLN
INTRAMUSCULAR | Status: AC
  Filled 2024-01-07: qty 10

## 2024-01-07 MED ORDER — DOCUSATE SODIUM 100 MG PO CAPS
100.0000 mg | ORAL_CAPSULE | Freq: Two times a day (BID) | ORAL | Status: DC
  Administered 2024-01-07 – 2024-01-10 (×6): 100 mg via ORAL
  Filled 2024-01-07 (×6): qty 1

## 2024-01-07 MED ORDER — HYDRALAZINE HCL 10 MG PO TABS: 10.0000 mg | ORAL_TABLET | Freq: Four times a day (QID) | ORAL | Status: DC | PRN

## 2024-01-07 MED ORDER — HYDROMORPHONE HCL 1 MG/ML IJ SOLN
0.2500 mg | INTRAMUSCULAR | Status: DC | PRN
  Administered 2024-01-07 (×2): 0.5 mg via INTRAVENOUS

## 2024-01-07 MED ORDER — ACETAMINOPHEN 500 MG PO TABS
1000.0000 mg | ORAL_TABLET | Freq: Once | ORAL | Status: DC
  Filled 2024-01-07: qty 2

## 2024-01-07 MED ORDER — ORAL CARE MOUTH RINSE: 15.0000 mL | Freq: Once | OROMUCOSAL | Status: AC

## 2024-01-07 MED ORDER — CEFAZOLIN SODIUM-DEXTROSE 2-4 GM/100ML-% IV SOLN
2.0000 g | Freq: Three times a day (TID) | INTRAVENOUS | Status: AC
  Administered 2024-01-07 – 2024-01-08 (×3): 2 g via INTRAVENOUS
  Filled 2024-01-07 (×3): qty 100

## 2024-01-07 MED ORDER — PROPOFOL 10 MG/ML IV BOLUS
INTRAVENOUS | Status: DC | PRN
  Administered 2024-01-07: 50 mg via INTRAVENOUS
  Administered 2024-01-07: 150 mg via INTRAVENOUS

## 2024-01-07 MED ORDER — ONDANSETRON HCL 4 MG PO TABS: 4.0000 mg | ORAL_TABLET | Freq: Four times a day (QID) | ORAL | Status: DC | PRN

## 2024-01-07 MED ORDER — VANCOMYCIN HCL 1000 MG IV SOLR
INTRAVENOUS | Status: DC | PRN
  Administered 2024-01-07: 1000 mg via TOPICAL

## 2024-01-07 MED ORDER — PROPOFOL 10 MG/ML IV BOLUS
INTRAVENOUS | Status: AC
Start: 2024-01-07 — End: ?
  Filled 2024-01-07: qty 20

## 2024-01-07 MED ORDER — MIDAZOLAM HCL 2 MG/2ML IJ SOLN
INTRAMUSCULAR | Status: AC
  Filled 2024-01-07: qty 2

## 2024-01-07 MED ORDER — ONDANSETRON HCL 4 MG/2ML IJ SOLN
4.0000 mg | Freq: Once | INTRAMUSCULAR | Status: DC | PRN
Start: 2024-01-07 — End: 2024-01-07

## 2024-01-07 MED ORDER — PROPOFOL 10 MG/ML IV BOLUS
INTRAVENOUS | Status: AC
  Filled 2024-01-07: qty 20

## 2024-01-07 MED ORDER — ONDANSETRON HCL 4 MG/2ML IJ SOLN
INTRAMUSCULAR | Status: DC | PRN
  Administered 2024-01-07: 4 mg via INTRAVENOUS

## 2024-01-07 MED ORDER — POLYETHYLENE GLYCOL 3350 17 G PO PACK
17.0000 g | PACK | Freq: Every day | ORAL | Status: DC | PRN
  Administered 2024-01-08 – 2024-01-09 (×2): 17 g via ORAL
  Filled 2024-01-07 (×2): qty 1

## 2024-01-07 MED ORDER — SODIUM CHLORIDE 0.9% FLUSH: 3.0000 mL | INTRAVENOUS | Status: DC | PRN

## 2024-01-07 MED ORDER — HYDROMORPHONE HCL 1 MG/ML IJ SOLN
INTRAMUSCULAR | Status: AC
  Filled 2024-01-07: qty 0.5

## 2024-01-07 MED ORDER — HYDROMORPHONE HCL 1 MG/ML IJ SOLN
INTRAMUSCULAR | Status: AC
  Filled 2024-01-07: qty 1

## 2024-01-07 MED ORDER — LACTATED RINGERS IV SOLN: INTRAVENOUS | Status: DC

## 2024-01-07 MED ORDER — OXYCODONE HCL 5 MG PO TABS
5.0000 mg | ORAL_TABLET | Freq: Once | ORAL | Status: AC | PRN
  Administered 2024-01-07: 5 mg via ORAL

## 2024-01-07 MED ORDER — PHENYLEPHRINE 80 MCG/ML (10ML) SYRINGE FOR IV PUSH (FOR BLOOD PRESSURE SUPPORT)
PREFILLED_SYRINGE | INTRAVENOUS | Status: DC | PRN
  Administered 2024-01-07: 160 ug via INTRAVENOUS

## 2024-01-07 MED ORDER — VANCOMYCIN HCL 1000 MG IV SOLR
INTRAVENOUS | Status: AC
  Filled 2024-01-07: qty 20

## 2024-01-07 MED ORDER — TRANEXAMIC ACID-NACL 1000-0.7 MG/100ML-% IV SOLN
1000.0000 mg | Freq: Once | INTRAVENOUS | Status: AC
  Administered 2024-01-07: 1000 mg via INTRAVENOUS
  Filled 2024-01-07: qty 100

## 2024-01-07 MED ORDER — PROPOFOL 500 MG/50ML IV EMUL
INTRAVENOUS | Status: DC | PRN
  Administered 2024-01-07: 50 ug/kg/min via INTRAVENOUS

## 2024-01-07 MED ORDER — OXYCODONE HCL 5 MG PO TABS
ORAL_TABLET | ORAL | Status: AC
  Filled 2024-01-07: qty 1

## 2024-01-07 MED ORDER — AMISULPRIDE (ANTIEMETIC) 5 MG/2ML IV SOLN: 10.0000 mg | Freq: Once | INTRAVENOUS | Status: DC | PRN

## 2024-01-07 MED ORDER — ROPIVACAINE HCL 5 MG/ML IJ SOLN
INTRAMUSCULAR | Status: DC | PRN
  Administered 2024-01-07: 30 mL via PERINEURAL

## 2024-01-07 MED ORDER — HYDROMORPHONE HCL 1 MG/ML IJ SOLN
INTRAMUSCULAR | Status: DC | PRN
  Administered 2024-01-07: .3 mg via INTRAVENOUS
  Administered 2024-01-07: .2 mg via INTRAVENOUS

## 2024-01-07 MED ORDER — DEXAMETHASONE SODIUM PHOSPHATE 10 MG/ML IJ SOLN
INTRAMUSCULAR | Status: DC | PRN
  Administered 2024-01-07: 10 mg

## 2024-01-07 MED ORDER — 0.9 % SODIUM CHLORIDE (POUR BTL) OPTIME
TOPICAL | Status: DC | PRN
  Administered 2024-01-07: 1000 mL

## 2024-01-07 MED ORDER — FENTANYL CITRATE (PF) 100 MCG/2ML IJ SOLN
INTRAMUSCULAR | Status: AC
  Filled 2024-01-07: qty 2

## 2024-01-07 MED ORDER — ONDANSETRON HCL 4 MG/2ML IJ SOLN: 4.0000 mg | Freq: Four times a day (QID) | INTRAMUSCULAR | Status: DC | PRN

## 2024-01-07 MED ORDER — FENTANYL CITRATE (PF) 250 MCG/5ML IJ SOLN
INTRAMUSCULAR | Status: DC | PRN
  Administered 2024-01-07 (×3): 50 ug via INTRAVENOUS

## 2024-01-07 SURGICAL SUPPLY — 64 items
BAG COUNTER SPONGE SURGICOUNT (BAG) ×1 IMPLANT
BIT DRILL 4.3 (BIT) ×1
BIT DRILL 4.3X300MM (BIT) IMPLANT
BIT DRILL LONG 3.3 (BIT) IMPLANT
BIT DRILL QC 3.3X195 (BIT) IMPLANT
BLADE CLIPPER SURG (BLADE) IMPLANT
BNDG COHESIVE 6X5 TAN ST LF (GAUZE/BANDAGES/DRESSINGS) ×1 IMPLANT
BNDG ELASTIC 4INX 5YD STR LF (GAUZE/BANDAGES/DRESSINGS) IMPLANT
BNDG ELASTIC 6INX 5YD STR LF (GAUZE/BANDAGES/DRESSINGS) IMPLANT
BNDG ELASTIC 6X10 VLCR STRL LF (GAUZE/BANDAGES/DRESSINGS) ×1 IMPLANT
BRUSH SCRUB EZ PLAIN DRY (MISCELLANEOUS) ×2 IMPLANT
CANISTER SUCT 3000ML PPV (MISCELLANEOUS) ×1 IMPLANT
CAP LOCK NCB (Cap) IMPLANT
CHLORAPREP W/TINT 26 (MISCELLANEOUS) ×1 IMPLANT
COVER SURGICAL LIGHT HANDLE (MISCELLANEOUS) ×1 IMPLANT
DERMABOND ADVANCED .7 DNX12 (GAUZE/BANDAGES/DRESSINGS) IMPLANT
DRAPE C-ARM 42X72 X-RAY (DRAPES) ×1 IMPLANT
DRAPE C-ARMOR (DRAPES) ×1 IMPLANT
DRAPE HALF SHEET 40X57 (DRAPES) ×2 IMPLANT
DRAPE SURG 17X23 STRL (DRAPES) ×1 IMPLANT
DRAPE SURG ORHT 6 SPLT 77X108 (DRAPES) ×2 IMPLANT
DRAPE U-SHAPE 47X51 STRL (DRAPES) ×1 IMPLANT
DRESSING MEPILEX FLEX 4X4 (GAUZE/BANDAGES/DRESSINGS) IMPLANT
DRSG ADAPTIC 3X8 NADH LF (GAUZE/BANDAGES/DRESSINGS) IMPLANT
DRSG MEPILEX FLEX 4X4 (GAUZE/BANDAGES/DRESSINGS) IMPLANT
DRSG MEPILEX POST OP 4X12 (GAUZE/BANDAGES/DRESSINGS) IMPLANT
DRSG MEPILEX POST OP 4X8 (GAUZE/BANDAGES/DRESSINGS) IMPLANT
ELECT REM PT RETURN 9FT ADLT (ELECTROSURGICAL) ×1 IMPLANT
ELECTRODE REM PT RTRN 9FT ADLT (ELECTROSURGICAL) ×1 IMPLANT
GAUZE PAD ABD 8X10 STRL (GAUZE/BANDAGES/DRESSINGS) ×3 IMPLANT
GAUZE SPONGE 4X4 12PLY STRL (GAUZE/BANDAGES/DRESSINGS) ×1 IMPLANT
GLOVE BIO SURGEON STRL SZ 6.5 (GLOVE) ×3 IMPLANT
GLOVE BIO SURGEON STRL SZ7.5 (GLOVE) ×4 IMPLANT
GLOVE BIOGEL PI IND STRL 6.5 (GLOVE) ×1 IMPLANT
GLOVE BIOGEL PI IND STRL 7.5 (GLOVE) ×1 IMPLANT
GOWN STRL REUS W/ TWL LRG LVL3 (GOWN DISPOSABLE) ×3 IMPLANT
K-WIRE FXSTD 280X2XNS SS (WIRE) ×4 IMPLANT
KIT BASIN OR (CUSTOM PROCEDURE TRAY) ×1 IMPLANT
KIT TURNOVER KIT B (KITS) ×1 IMPLANT
KWIRE FXSTD 280X2XNS SS (WIRE) IMPLANT
NS IRRIG 1000ML POUR BTL (IV SOLUTION) ×1 IMPLANT
PACK TOTAL JOINT (CUSTOM PROCEDURE TRAY) ×1 IMPLANT
PAD ARMBOARD 7.5X6 YLW CONV (MISCELLANEOUS) ×2 IMPLANT
PAD CAST 4YDX4 CTTN HI CHSV (CAST SUPPLIES) ×1 IMPLANT
PADDING CAST COTTON 6X4 STRL (CAST SUPPLIES) ×1 IMPLANT
PLATE DIST FEM 12H (Plate) IMPLANT
SCREW 5.0 80MM (Screw) IMPLANT
SCREW NCB 3.5X75X5X6.2XST (Screw) IMPLANT
SCREW NCB 4.0MX38M (Screw) IMPLANT
SCREW NCB 5.0X36MM (Screw) IMPLANT
SCREW NCB 5.0X38 (Screw) IMPLANT
SCREW NCB 5.0X85MM (Screw) IMPLANT
SPONGE T-LAP 18X18 ~~LOC~~+RFID (SPONGE) IMPLANT
STAPLER VISISTAT 35W (STAPLE) ×1 IMPLANT
SUCTION TUBE FRAZIER 10FR DISP (SUCTIONS) ×1 IMPLANT
SUT ETHILON 3 0 PS 1 (SUTURE) ×2 IMPLANT
SUT MNCRL AB 3-0 PS2 18 (SUTURE) IMPLANT
SUT MON AB 2-0 CT1 36 (SUTURE) IMPLANT
SUT VIC AB 0 CT1 27XBRD ANBCTR (SUTURE) IMPLANT
SUT VIC AB 1 CT1 27XBRD ANBCTR (SUTURE) IMPLANT
SUT VIC AB 2-0 CT1 TAPERPNT 27 (SUTURE) ×2 IMPLANT
TOWEL GREEN STERILE (TOWEL DISPOSABLE) ×2 IMPLANT
TRAY FOLEY MTR SLVR 16FR STAT (SET/KITS/TRAYS/PACK) IMPLANT
WATER STERILE IRR 1000ML POUR (IV SOLUTION) ×2 IMPLANT

## 2024-01-07 NOTE — Transfer of Care (Signed)
 Immediate Anesthesia Transfer of Care Note  Patient: Melinda Harrison  Procedure(s) Performed: OPEN REDUCTION INTERNAL FIXATION (ORIF) DISTAL FEMUR FRACTURE (Left: Knee)  Patient Location: PACU  Anesthesia Type:GA combined with regional for post-op pain  Level of Consciousness: drowsy and patient cooperative  Airway & Oxygen Therapy: Patient Spontanous Breathing  Post-op Assessment: Report given to RN and Post -op Vital signs reviewed and stable  Post vital signs: Reviewed and stable  Last Vitals:  Vitals Value Taken Time  BP 125/72 01/07/24 1326  Temp    Pulse 100 01/07/24 1329  Resp 13 01/07/24 1329  SpO2 91 % 01/07/24 1329  Vitals shown include unfiled device data.  Last Pain:  Vitals:   01/07/24 0950  TempSrc:   PainSc: 9       Patients Stated Pain Goal: 0 (01/07/24 0950)  Complications: No notable events documented.

## 2024-01-07 NOTE — Progress Notes (Signed)
 Transition of Care [facility]) - Inpatient Brief Assessment   Patient Details  Name: VELINA DROLLINGER MRN: [id number] Date of Birth: [birth year]  Transition of Care [facility]) CM/SW Contact:    Robynn Eileen Hoose, RN Phone Number: 01/07/2024, 3:42 PM   Clinical Narrative:  Patient from home with spouse, c./o fall from ladder. Patient had ORIF left leg done today. TOC will watch for discharge needs and possible PT/OT eval and recommendations.  Transition of Care Asessment: Insurance and Status: (P) Insurance coverage has been reviewed Patient has primary care physician: (P) Yes Home environment has been reviewed: (P) Home with spouse Prior level of function:: (P) Independent Prior/Current Home Services: (P) No current home services ([facility] in 2024) Social Drivers of Health Review: (P) SDOH reviewed no interventions necessary Readmission risk has been reviewed: (P) Yes Transition of care needs: (P) transition of care needs identified, TOC will continue to follow (Awaiting PT/OT eval.)

## 2024-01-07 NOTE — Op Note (Signed)
 Orthopaedic Surgery Operative Note (CSN: [id number] ) Date of Surgery: 01/07/2024  Admit Date: 01/04/2024   Diagnoses: Pre-Op Diagnoses: Left supracondylar/intracondylar distal femur fracture  Post-Op Diagnosis: Same  Procedures: CPT 27513-Open reduction internal fixation of left distal femur fracture  Surgeons : Primary: Kendal Franky SQUIBB, MD  Assistant: Lauraine Hudson, PA-C  Location: OR 3   Anesthesia: General   Antibiotics: Ancef  2g preop with 1 gm vancomycin  powder placed topically   Tourniquet time: None    Estimated Blood Loss: 100 mL  Complications: None  Specimens: None   Implants: Implant Name Type Inv. Item Serial No. Manufacturer Lot No. LRB No. Used Action  PLATE DIST FEM 87Y - [id number] Plate PLATE DIST FEM 87Y  [employer](ORTH,TRAU,BIO,SG) ON TRAY Left 1 Implanted  SCREW NCB 4.0MX38M - [id number] Screw SCREW NCB 4.0MX38M  [employer](ORTH,TRAU,BIO,SG) ON TRAY Left 1 Implanted  SCREW NCB 5.0X36MM - [id number] Screw SCREW NCB 5.0X36MM  ZIM[employer]ON(ORTH,TRAU,BIO,SG) ON TRAY Left 1 Implanted  SCREW NCB 5.0X38 - [id number] Screw SCREW NCB 5.0X38  ZIM[employer]ON(ORTH,TRAU,BIO,SG) ON TRAY Left 1 Implanted  SCREW NCB 6.4K24K4K3.2XST - [id number] Screw SCREW NCB [id number]  ZIMMER [employer]ON(ORTH,TRAU,BIO,SG) ON TRAY Left 1 Implanted  SCREW 5.0 - [id number] Screw SCREW 5.0  ZIMMER [employer]ON(ORTH,TRAU,BIO,SG) ON TRAY Left 2 Implanted  SCREW NCB 5.0X85MM - [id number] Screw SCREW NCB 5.0X85MM  ZIMMER [employer]ON(ORTH,TRAU,BIO,SG) ON TRAY Left 2 Implanted  CAP LOCK NCB - [id number] Cap CAP LOCK NCB  ZIMMER [employer]ON(ORTH,TRAU,BIO,SG) ON TRAY Left 7 Implanted     Indications for Surgery: 58 year old female who sustained a left intra-articular supracondylar distal femur fracture after previously sustaining a left tibial plateau fracture in April 2024.  I felt that she was indicated for open reduction internal fixation.  Risks and benefits were discussed with the patient.  Risks  include but not limited to bleeding, infection, malunion, nonunion, hardware failure, hardware rotation, nerve or blood vessel injury, knee stiffness, posttraumatic arthritis, DVT, and the possibility anesthetic complications.  She agreed to proceed with surgery and consent was obtained.  Operative Findings: Open duction internal fixation of left supracondylar/intercondylar distal femur fracture using [employer] NCB distal femoral locking plate.  Procedure: The patient was identified in the preoperative holding area. Consent was confirmed with the patient and their family and all questions were answered. The operative extremity was marked after confirmation with the patient. she was then brought back to the operating room by our anesthesia colleagues.  She was placed under general anesthetic and carefully transferred over to radiolucent flattop table.  A bump was placed under her operative hip.  The left lower extremity was then prepped and draped in usual sterile fashion.  A timeout was performed to verify the patient, the procedure, and the extremity.  Preoperative antibiotics were dosed.  The hip and knee were flexed over a triangle and fluoroscopic imaging was obtained to show the unstable nature of her injury.  At the proximal portion of the previous incision I extended this into a lateral parapatellar incision carried it down through skin and subcutaneous tissue.  I incised through the IT band and entered the joint just lateral to the patellar tendon and patella.  I was able to visualize the intra-articular split.  I then released the posteriorly to access the lateral condyle distal femur.  Traction was applied by my assistant and alignment was obtained by adjusting the triangle to reduce the metaphysis.  I then cleaned out the hematoma from the intra-articular split and mobilized the lateral condyle into  anatomic reduction using reduction tenaculum.  I was able to visualize the anatomic reduction  and then used 2.0 mm guidewires from lateral to medial and brought them out the medial side and brought them flush with the lateral condyle.  I kept my reduction tenaculum in place that was it was anterior to the placement of the plate.  I then slid a 12 hole [employer] NCB distal femoral locking plate attached to the targeting arm.  I placed a K wire distally to align the distal portion of the plate.  I then percutaneously placed a 3.3 mm drill bit along the proximal portion of the plate.  I then drilled and placed 5.0 millimeter screws distally to bring the plate flush to bone.  I then percutaneously placed 5.0 millimeter screws in the femoral shaft to correct the coronal alignment.  I replaced the 3.3 mm drill bit proximally with a 4.0 millimeter screw.  Locking caps were placed and a 5.0 millimeter screws.  The targeting arm was removed and 3 more screws distally were placed for a total of five 5.0 millimeter screws.  Locking caps were placed in all of the distal screws and the K wires were removed.  Final fluoroscopic imaging was obtained.  The incision was copiously irrigated.  A gram of vancomycin  powder was placed into the incision.  A layered closure of 0 Vicryl, 2-0 Monocryl and 3-0 Monocryl with Dermabond was used to close the skin.  Sterile dressings were applied.  The patient was then awoke from anesthesia and taken to the PACU in stable condition.  Post Op Plan/Instructions: The patient will be nonweightbearing to the left lower extremity.  She will receive postoperative Ancef .  She will receive Lovenox  for DVT prophylaxis and discharged on an oral DOAC.  Will have her mobilize with physical and Occupational Therapy.  I was present and performed the entire surgery.  Lauraine Hudson, PA-C did assist me throughout the case. An assistant was necessary given the difficulty in approach, maintenance of reduction and ability to instrument the fracture.   Franky Light, MD [facility]

## 2024-01-07 NOTE — Anesthesia Preprocedure Evaluation (Addendum)
 Anesthesia Evaluation  Patient identified by MRN, date of birth, ID band Patient awake    Reviewed: Allergy & Precautions, NPO status , Patient's Chart, lab work & pertinent test results  Airway Mallampati: III  TM Distance: >3 FB Neck ROM: Full    Dental  (+) Teeth Intact, Dental Advisory Given   Pulmonary asthma , COPD (uses rescue inhaler about 1-2x/mo),  COPD inhaler   Pulmonary exam normal breath sounds clear to auscultation       Cardiovascular hypertension (115/65 preop), Pt. on medications + Peripheral Vascular Disease and + DVT  Normal cardiovascular exam Rhythm:Regular Rate:Normal     Neuro/Psych  Headaches PSYCHIATRIC DISORDERS Anxiety Depression       GI/Hepatic Neg liver ROS, hiatal hernia,GERD  Controlled,,S/p gastric bypass   Endo/Other  negative endocrine ROS    Renal/GU negative Renal ROS  negative genitourinary   Musculoskeletal  (+) Arthritis , Osteoarthritis,  L distal femur fx   Abdominal   Peds  Hematology negative hematology ROS (+)   Anesthesia Other Findings   Reproductive/Obstetrics negative OB ROS                             Anesthesia Physical Anesthesia Plan  ASA: 3  Anesthesia Plan: General and Regional   Post-op Pain Management: Regional block* and Tylenol  PO (pre-op)*   Induction: Intravenous  PONV Risk Score and Plan: 3 and Ondansetron , Dexamethasone , Midazolam  and Treatment may vary due to age or medical condition  Airway Management Planned: LMA  Additional Equipment: None  Intra-op Plan:   Post-operative Plan: Extubation in OR  Informed Consent: I have reviewed the patients History and Physical, chart, labs and discussed the procedure including the risks, benefits and alternatives for the proposed anesthesia with the patient or authorized representative who has indicated his/her understanding and acceptance.     Dental advisory  given  Plan Discussed with: CRNA  Anesthesia Plan Comments:        Anesthesia Quick Evaluation

## 2024-01-07 NOTE — Interval H&P Note (Signed)
 History and Physical Interval Note:  01/07/2024 11:30 AM  Melinda Harrison  has presented today for surgery, with the diagnosis of left femur fracture.  The various methods of treatment have been discussed with the patient and family. After consideration of risks, benefits and other options for treatment, the patient has consented to  Procedure(s): OPEN REDUCTION INTERNAL FIXATION (ORIF) DISTAL FEMUR FRACTURE (Left) as a surgical intervention.  [name] history has been reviewed, patient examined, no change in status, stable for surgery.  I have reviewed the patient's chart and labs.  Questions were answered to the patient's satisfaction.     Keegan Bensch P Aryannah Mohon

## 2024-01-07 NOTE — Anesthesia Procedure Notes (Signed)
 Procedure Name: LMA Insertion Date/Time: 01/07/2024 12:07 PM  Performed by: Joshua Millman, CRNAPre-anesthesia Checklist: Patient identified, Emergency Drugs available, Suction available and Patient being monitored Patient Re-evaluated:Patient Re-evaluated prior to induction Oxygen Delivery Method: Circle system utilized Preoxygenation: Pre-oxygenation with 100% oxygen Induction Type: IV induction Ventilation: Mask ventilation without difficulty LMA: LMA inserted LMA Size: 4.0 Number of attempts: 1 Airway Equipment and Method: Bite block Placement Confirmation: positive ETCO2 and breath sounds checked- equal and bilateral Tube secured with: Tape Dental Injury: Teeth and Oropharynx as per pre-operative assessment

## 2024-01-07 NOTE — Anesthesia Procedure Notes (Signed)
 Anesthesia Regional Block: Adductor canal block   Pre-Anesthetic Checklist: , timeout performed,  Correct Patient, Correct Site, Correct Laterality,  Correct Procedure, Correct Position, site marked,  Risks and benefits discussed,  Surgical consent,  Pre-op evaluation,  At surgeon's request and post-op pain management  Laterality: Left  Prep: Maximum Sterile Barrier Precautions used, chloraprep       Needles:  Injection technique: Single-shot  Needle Type: Echogenic Stimulator Needle     Needle Length: 9cm  Needle Gauge: 22     Additional Needles:   Procedures:,,,, ultrasound used (permanent image in chart),,    Narrative:  Start time: 01/07/2024 11:35 AM End time: 01/07/2024 11:40 AM Injection made incrementally with aspirations every 5 mL.  Performed by: Personally  Anesthesiologist: Merla Almarie HERO, DO  Additional Notes: Monitors applied. No increased pain on injection. No increased resistance to injection. Injection made in 5cc increments. Good needle visualization. Patient tolerated procedure well.

## 2024-01-07 NOTE — Anesthesia Postprocedure Evaluation (Signed)
 Anesthesia Post Note  Patient: Melinda Harrison  Procedure(s) Performed: OPEN REDUCTION INTERNAL FIXATION (ORIF) DISTAL FEMUR FRACTURE (Left: Knee)     Patient location during evaluation: PACU Anesthesia Type: Regional and General Level of consciousness: awake and alert, oriented and patient cooperative Pain management: pain level controlled Vital Signs Assessment: post-procedure vital signs reviewed and stable Respiratory status: spontaneous breathing, nonlabored ventilation and respiratory function stable Cardiovascular status: blood pressure returned to baseline and stable Postop Assessment: no apparent nausea or vomiting Anesthetic complications: no   No notable events documented.  Last Vitals:  Vitals:   01/07/24 1330 01/07/24 1345  BP: 139/76 (!) 143/74  Pulse: 100 99  Resp: 15 11  Temp: 36.5 C   SpO2: 93% 99%    Last Pain:  Vitals:   01/07/24 1330  TempSrc:   PainSc: 5                  Almarie CHRISTELLA Marchi

## 2024-01-07 NOTE — Care Management Important Message (Signed)
 Important Message  Patient Details  Name: Melinda Harrison MRN: [id number] Date of Birth: Jul 25, 1966   Important Message Given:  Yes - Medicare IM     Dorena Bodo 01/07/2024, 2:09 PM

## 2024-01-08 ENCOUNTER — Encounter (HOSPITAL_COMMUNITY): Payer: Self-pay | Admitting: Student

## 2024-01-08 LAB — CBC
HCT: 30.2 % — ABNORMAL LOW (ref 36.0–46.0)
Hemoglobin: 10.4 g/dL — ABNORMAL LOW (ref 12.0–15.0)
MCH: 33.5 pg (ref 26.0–34.0)
MCHC: 34.4 g/dL (ref 30.0–36.0)
MCV: 97.4 fL (ref 80.0–100.0)
Platelets: 275 10*3/uL (ref 150–400)
RBC: 3.1 MIL/uL — ABNORMAL LOW (ref 3.87–5.11)
RDW: 12.9 % (ref 11.5–15.5)
WBC: 16.3 10*3/uL — ABNORMAL HIGH (ref 4.0–10.5)
nRBC: 0 % (ref 0.0–0.2)

## 2024-01-08 LAB — BASIC METABOLIC PANEL
Anion gap: 10 (ref 5–15)
BUN: 11 mg/dL (ref 6–20)
CO2: 28 mmol/L (ref 22–32)
Calcium: 8.9 mg/dL (ref 8.9–10.3)
Chloride: 100 mmol/L (ref 98–111)
Creatinine, Ser: 0.7 mg/dL (ref 0.44–1.00)
GFR, Estimated: >60 mL/min (ref >60)
Glucose, Bld: 136 mg/dL — ABNORMAL HIGH (ref 70–99)
Potassium: 4.1 mmol/L (ref 3.5–5.1)
Sodium: 138 mmol/L (ref 135–145)

## 2024-01-08 MED ORDER — APIXABAN 2.5 MG PO TABS
2.5000 mg | ORAL_TABLET | Freq: Two times a day (BID) | ORAL | Status: DC
  Administered 2024-01-08 – 2024-01-10 (×4): 2.5 mg via ORAL
  Filled 2024-01-08 (×4): qty 1

## 2024-01-08 MED ORDER — METHOCARBAMOL 500 MG PO TABS
500.0000 mg | ORAL_TABLET | Freq: Four times a day (QID) | ORAL | Status: DC
  Administered 2024-01-08 – 2024-01-10 (×10): 500 mg via ORAL
  Filled 2024-01-08 (×10): qty 1

## 2024-01-08 MED ORDER — METHOCARBAMOL 1000 MG/10ML IJ SOLN: 500.0000 mg | Freq: Four times a day (QID) | INTRAMUSCULAR | Status: DC

## 2024-01-08 MED ORDER — HYDROMORPHONE HCL 1 MG/ML IJ SOLN
0.5000 mg | INTRAMUSCULAR | Status: DC | PRN
  Administered 2024-01-09 – 2024-01-10 (×6): 1 mg via INTRAVENOUS
  Filled 2024-01-08 (×7): qty 1

## 2024-01-08 MED ORDER — ACETAMINOPHEN 500 MG PO TABS
1000.0000 mg | ORAL_TABLET | Freq: Four times a day (QID) | ORAL | Status: DC
  Administered 2024-01-08 – 2024-01-10 (×8): 1000 mg via ORAL
  Filled 2024-01-08 (×8): qty 2

## 2024-01-08 NOTE — Evaluation (Signed)
 Occupational Therapy Evaluation Patient Details Name: Melinda Harrison MRN: [id number] DOB: 03/22/1966 Today's Date: 01/08/2024   History of Present Illness Pt is a 58 y.o. female admitted after fall s/p L ORIF distal femur fx.  PMH includes L tibial ORIF 4/29, HTN, COPD, DVT, PAD, lymphedema, asthma, IBS, PTSD, anxiety, depression.   Clinical Impression   Pt c/o significant pain at rest, 9/10 to LLE. Co-treat with PT. Pt lives with husband and daughter who work during the day, PLOF independent with occasional use of RW for support. Pt has history of concussions and trouble with word finding and memory at baseline, overall grossly WFLs for orientation and communication. Pt currently requires min A x2 for ambulation with RW, able to ambulate up to 20 feet to toilet and back during eval. Pt eager to return home, has 4 stairs to enter home with rail on L side. Pt will practice stairs tomorrow, hopeful to complete stairs and ensure safe transition home with HHOT. Will continue to follow acutely to progress as able.       If plan is discharge home, recommend the following: A lot of help with walking and/or transfers;A little help with bathing/dressing/bathroom;Assistance with cooking/housework;Assist for transportation;Help with stairs or ramp for entrance    Functional Status Assessment  Patient has had a recent decline in their functional status and demonstrates the ability to make significant improvements in function in a reasonable and predictable amount of time.  Equipment Recommendations  None recommended by OT    Recommendations for Other Services       Precautions / Restrictions Precautions Precautions: Fall Restrictions Weight Bearing Restrictions Per Provider Order: Yes LLE Weight Bearing Per Provider Order: Non weight bearing      Mobility Bed Mobility Overal bed mobility: Needs Assistance Bed Mobility: Supine to Sit     Supine to sit: Min assist, HOB elevated, Used rails      General bed mobility comments: min A for supine to sit for assistance with LLE, used rails    Transfers Overall transfer level: Needs assistance Equipment used: Rolling walker (2 wheels) Transfers: Sit to/from Stand Sit to Stand: Min assist, +2 physical assistance           General transfer comment: min A x2 for safety with STS and ambulating with RW      Balance Overall balance assessment: Needs assistance Sitting-balance support: No upper extremity supported, Feet supported Sitting balance-Leahy Scale: Good     Standing balance support: No upper extremity supported, During functional activity Standing balance-Leahy Scale: Poor Standing balance comment: reliant on BUEs with RW for support                           ADL either performed or assessed with clinical judgement   ADL Overall ADL's : Needs assistance/impaired Eating/Feeding: Independent   Grooming: Set up;Sitting   Upper Body Bathing: Set up;Sitting   Lower Body Bathing: Moderate assistance;Sitting/lateral leans   Upper Body Dressing : Set up;Sitting   Lower Body Dressing: Moderate assistance;Sitting/lateral leans   Toilet Transfer: Minimal assistance;+2 for safety/equipment;Rolling walker (2 wheels)   Toileting- Clothing Manipulation and Hygiene: Set up;Sitting/lateral lean       Functional mobility during ADLs: Minimal assistance;+2 for safety/equipment;Rolling walker (2 wheels) General ADL Comments: Pt does well with UB ADLs, set up sitting. Mod A for LB ADLs, NWB to LLE using RW requires min A x2 assistance for safety.     Vision Baseline Vision/History:  1 Wears glasses Ability to See in Adequate Light: 0 Adequate Patient Visual Report: No change from baseline       Perception         Praxis         Pertinent Vitals/Pain Pain Assessment Pain Assessment: 0-10 Pain Score: 9  Pain Location: L leg, back Pain Descriptors / Indicators: Aching Pain Intervention(s): Monitored  during session     Extremity/Trunk Assessment Upper Extremity Assessment Upper Extremity Assessment: Overall WFL for tasks assessed           Communication Communication Communication: No apparent difficulties   Cognition Arousal: Alert Behavior During Therapy: WFL for tasks assessed/performed Overall Cognitive Status: History of cognitive impairments - at baseline                                 General Comments: history of multiple concussions, Pt states memory and word finding have been difficult for years.     General Comments  BP sitting 136/69    Exercises     Shoulder Instructions      Home Living Family/patient expects to be discharged to:: Private residence Living Arrangements: Spouse/significant other Available Help at Discharge: Family;Available PRN/intermittently Type of Home: House Home Access: Stairs to enter [employer] of Steps: 4 Entrance Stairs-Rails: Left Home Layout: One level     Bathroom Shower/Tub: Walk-in shower;Tub/shower unit   Bathroom Toilet: Handicapped height     Home Equipment: [employer] (2 wheels);Crutches;Shower seat   Additional Comments: Pt lives with husband and daughter who work during the day      Prior Functioning/Environment Prior Level of Function : Independent/Modified Independent             Mobility Comments: ind with occasional use of RW ADLs Comments: ind        OT Problem List: Decreased strength;Decreased range of motion;Decreased activity tolerance;Impaired balance (sitting and/or standing);Pain      OT Treatment/Interventions: Self-care/ADL training;Therapeutic exercise;Energy conservation;DME and/or AE instruction;Therapeutic activities;Patient/family education;Balance training    OT Goals(Current goals can be found in the care plan section) Acute Rehab OT Goals Patient Stated Goal: to return home OT Goal Formulation: With patient Time For Goal Achievement:  01/22/24 Potential to Achieve Goals: Good  OT Frequency: Min 1X/week    Co-evaluation PT/OT/SLP Co-Evaluation/Treatment: Yes Reason for Co-Treatment: Complexity of the patient's impairments (multi-system involvement);For patient/therapist safety;To address functional/ADL transfers   OT goals addressed during session: ADL's and self-care;Proper use of Adaptive equipment and DME      AM-PAC OT 6 Clicks Daily Activity     Outcome Measure Help from another person eating meals?: None Help from another person taking care of personal grooming?: A Little Help from another person toileting, which includes using toliet, bedpan, or urinal?: A Little Help from another person bathing (including washing, rinsing, drying)?: A Lot Help from another person to put on and taking off regular upper body clothing?: A Little Help from another person to put on and taking off regular lower body clothing?: A Lot 6 Click Score: 17   End of Session Equipment Utilized During Treatment: Gait belt;Rolling walker (2 wheels) Nurse Communication: Mobility status  Activity Tolerance: Patient tolerated treatment well Patient left: in chair;with call bell/phone within reach  OT Visit Diagnosis: Unsteadiness on feet (R26.81);Other abnormalities of gait and mobility (R26.89);Muscle weakness (generalized) (M62.81);Pain;History of falling (Z91.81) Pain - Right/Left: Left Pain - part of body: Leg  Time: 8884-8848 OT Time Calculation (min): 36 min Charges:  OT General Charges $OT Visit: 1 Visit OT Evaluation $OT Eval Moderate Complexity: 1 [address] OTR/L   Elouise JONELLE Bott 01/08/2024, 1:05 PM

## 2024-01-08 NOTE — Progress Notes (Addendum)
 Orthopaedic Trauma Progress Note  SUBJECTIVE: Doing fair this morning.  Had a rough time overnight with pain control and getting assistance in the room.  Notes pain was about 9/10 when she woke up this morning, recently received morphine  and pain is starting to ease off.  No chest pain. No SOB. No nausea/vomiting. No other complaints.  Tolerating diet and fluids, just ordered breakfast.  Family member at bedside.  Patient has not been up out of bed yet since surgery.  Noted to concern with her UTI upon admission, does not feel that she has had appropriate pericare  OBJECTIVE:  Vitals:   01/08/24 0508 01/08/24 0721  BP: 102/71 107/66  Pulse: 90 91  Resp: 17 18  Temp: 98.2 F (36.8 C) 98 F (36.7 C)  SpO2: 99% 98%    General: Sitting up in bed, no acute distress.  Pleasant and cooperative Respiratory: No increased work of breathing. CTA Cardiac: RRR Abdomen: normoactive BS Left lower extremity: Dressings clean, dry, intact.  Soreness throughout the distal thigh as expected.  Some soreness to the calf but no areas of significant tenderness.  Tolerates gentle ankle dorsiflexion/plantarflexion.  Able to wiggle the toes.  Endorses sensation to all aspects of the foot.  2+ DP pulse.  IMAGING: Stable post op imaging.   LABS:  Results for orders placed or performed during the hospital encounter of 01/04/24 (from the past 24 hours)  Basic metabolic panel     Status: Abnormal   Collection Time: 01/08/24  5:13 AM  Result Value Ref Range   Sodium 138 135 - 145 mmol/L   Potassium 4.1 3.5 - 5.1 mmol/L   Chloride 100 98 - 111 mmol/L   CO2 28 22 - 32 mmol/L   Glucose, Bld 136 (H) 70 - 99 mg/dL   BUN 11 6 - 20 mg/dL   Creatinine, Ser 9.29 0.44 - 1.00 mg/dL   Calcium 8.9 8.9 - 89.6 mg/dL   GFR, Estimated >39 >39 mL/min   Anion gap 10 5 - 15  CBC     Status: Abnormal   Collection Time: 01/08/24  5:13 AM  Result Value Ref Range   WBC 16.3 (H) 4.0 - 10.5 K/uL   RBC 3.10 (L) 3.87 - 5.11 MIL/uL    Hemoglobin 10.4 (L) 12.0 - 15.0 g/dL   HCT 69.7 (L) 63.9 - 53.9 %   MCV 97.4 80.0 - 100.0 fL   MCH 33.5 26.0 - 34.0 pg   MCHC 34.4 30.0 - 36.0 g/dL   RDW 87.0 88.4 - 84.4 %   Platelets 275 150 - 400 K/uL   nRBC 0.0 0.0 - 0.2 %    ASSESSMENT: Melinda Harrison is a 58 y.o. female, 1 Day Post-Op s/p OPEN REDUCTION INTERNAL FIXATION LEFT DISTAL FEMUR FRACTURE  CV/Blood loss: Acute blood loss anemia, Hgb 10.4 this morning. Hemodynamically stable  PLAN: Weightbearing: NWB LLE ROM: Okay for knee and ankle motion as tolerated Incisional and dressing care: Reinforce dressings as needed  Showering: Order placed for bed bath today.  Patient okay to begin showering/getting incisions wet 01/10/2024. Orthopedic device(s): None  Pain management:  1. Tylenol  1000 mg q 6 hours scheduled 2. Robaxin  500 mg QID 3. Oxycodone  5-15 mg q 4 hours PRN 4. Dilaudid  0.5-1 mg q 3 hours PRN VTE prophylaxis: Lovenox , SCDs ID:  Ancef  2gm post op Foley/Lines:  No foley, KVO IVFs Impediments to Fracture Healing: Vitamin D  level 73 when checked in April 2024, continue home dose supplementation Dispo: Continue  to work on pain control today, I have adjusted some of her medications.  PT/OT evaluation today, dispo pending.  Expect patient will need home health therapies.  She is requesting same home health agency as last admission, I will have to confirm this agency as I am unsure who it was.  Hope to be able to discontinue pure wick once more mobile with therapies today  D/C recommendations: -Oxycodone , Tylenol , and Robaxin  for pain control -Eliquis  2.5 mg twice daily x 30 days for DVT prophylaxis -Continue home dose Vit D supplementation  Follow - up plan: 2 weeks after discharge for wound check and repeat x-rays   Contact information:  Franky Light MD, Lauraine Moores PA-C. After hours and holidays please check [email or web address] for group call information for [facility]   Lauraine PATRIC Moores, PA-C [phone]  (office) [email or web address]
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
## 2024-01-08 NOTE — Evaluation (Signed)
 Physical Therapy Evaluation  Patient Details Name: Melinda Harrison MRN: [id number] DOB: May 01, 1966 Today's Date: 01/08/2024  History of Present Illness  Pt is a 58 y.o. female admitted after fall s/p L ORIF distal femur fx.  PMH includes L tibial ORIF 4/29, HTN, COPD, DVT, PAD, lymphedema, asthma, IBS, PTSD, anxiety, depression.   Clinical Impression  Pt admitted with above diagnosis. Pt currently with functional limitations due to the deficits listed below (see PT Problem List). At the time of PT eval pt was able to perform transfers and ambulation with gross min assist and RW for support. +2 helpful for management of lines/equipment. Pt will need stair training prior to d/c as sh e has 4 STE and is NWB on the LLE. Acutely, pt will benefit from acute skilled PT to increase their independence and safety with mobility to allow discharge.           If plan is discharge home, recommend the following: A little help with walking and/or transfers;A little help with bathing/dressing/bathroom;Assistance with cooking/housework;Assist for transportation;Help with stairs or ramp for entrance   Can travel by private vehicle        Equipment Recommendations None recommended by PT  Recommendations for Other Services       Functional Status Assessment Patient has had a recent decline in their functional status and demonstrates the ability to make significant improvements in function in a reasonable and predictable amount of time.     Precautions / Restrictions Precautions Precautions: Fall Restrictions Weight Bearing Restrictions Per Provider Order: Yes LLE Weight Bearing Per Provider Order: Non weight bearing      Mobility  Bed Mobility Overal bed mobility: Needs Assistance Bed Mobility: Supine to Sit     Supine to sit: Min assist, HOB elevated, Used rails     General bed mobility comments: min A for supine to sit for assistance with LLE, used rails    Transfers Overall transfer  level: Needs assistance Equipment used: Rolling walker (2 wheels) Transfers: Sit to/from Stand Sit to Stand: Min assist, +2 physical assistance           General transfer comment: +2 min assist for power up to full stand and to gain/maintain standing balance.    Ambulation/Gait Ambulation/Gait assistance: Min assist, +2 safety/equipment Gait Distance (Feet): 10 Feet (x2) Assistive device: Rolling walker (2 wheels) Gait Pattern/deviations: Decreased stride length, Step-to pattern Gait velocity: Decreased Gait velocity interpretation: <1.31 ft/sec, indicative of household ambulator   General Gait Details: VC's for sequencing and general safety. +2 for management of IV pole and lines. Hands on guarding throughout with occasional assist for balance and walker management.  Stairs            Wheelchair Mobility     Tilt Bed    Modified Rankin (Stroke Patients Only)       Balance Overall balance assessment: Needs assistance Sitting-balance support: No upper extremity supported, Feet supported Sitting balance-Leahy Scale: Good     Standing balance support: No upper extremity supported, During functional activity Standing balance-Leahy Scale: Poor Standing balance comment: reliant on BUEs with RW for support                             Pertinent Vitals/Pain Pain Assessment Pain Assessment: 0-10 Pain Score: 9  Pain Location: L leg, back Pain Descriptors / Indicators: Aching Pain Intervention(s): Limited activity within patient's tolerance, Monitored during session, Repositioned    Home Living  Family/patient expects to be discharged to:: Private residence Living Arrangements: Spouse/significant other Available Help at Discharge: Family;Available PRN/intermittently Type of Home: House Home Access: Stairs to enter Entrance Stairs-Rails: Left Entrance Stairs-Number of Steps: 4   Home Layout: One level Home Equipment: Agricultural [employer] (2  wheels);Crutches;Shower seat Additional Comments: Pt lives with husband and daughter who work during the day    Prior Function Prior Level of Function : Independent/Modified Independent             Mobility Comments: ind with occasional use of RW ADLs Comments: ind     Extremity/Trunk Assessment   Upper Extremity Assessment Upper Extremity Assessment: Defer to OT evaluation    Lower Extremity Assessment Lower Extremity Assessment: Generalized weakness    Cervical / Trunk Assessment Cervical / Trunk Assessment: Normal  Communication   Communication Communication: No apparent difficulties Cueing Techniques: Verbal cues;Gestural cues  Cognition Arousal: Alert Behavior During Therapy: WFL for tasks assessed/performed Overall Cognitive Status: History of cognitive impairments - at baseline                                 General Comments: history of multiple concussions, Pt states memory and word finding have been difficult for years.        General Comments General comments (skin integrity, edema, etc.): BP sitting 136/69    Exercises     Assessment/Plan    PT Assessment Patient needs continued PT services  PT Problem List Decreased strength;Decreased activity tolerance;Decreased mobility;Decreased balance;Decreased knowledge of use of DME;Decreased safety awareness;Decreased knowledge of precautions;Pain       PT Treatment Interventions DME instruction;Stair training;Gait training;Functional mobility training;Therapeutic activities;Therapeutic exercise;Balance training;Patient/family education    PT Goals (Current goals can be found in the Care Plan section)  Acute Rehab PT Goals Patient Stated Goal: Be able to return home PT Goal Formulation: With patient Time For Goal Achievement: 01/22/24 Potential to Achieve Goals: Good    Frequency Min 1X/week     Co-evaluation PT/OT/SLP Co-Evaluation/Treatment: Yes Reason for Co-Treatment: Complexity  of the patient's impairments (multi-system involvement);For patient/therapist safety;To address functional/ADL transfers PT goals addressed during session: Mobility/safety with mobility;Balance;Proper use of DME;Strengthening/ROM OT goals addressed during session: ADL's and self-care;Proper use of Adaptive equipment and DME       AM-PAC PT 6 Clicks Mobility  Outcome Measure Help needed turning from your back to your side while in a flat bed without using bedrails?: A Little Help needed moving from lying on your back to sitting on the side of a flat bed without using bedrails?: A Little Help needed moving to and from a bed to a chair (including a wheelchair)?: A Little Help needed standing up from a chair using your arms (e.g., wheelchair or bedside chair)?: A Lot Help needed to walk in hospital room?: A Lot Help needed climbing 3-5 steps with a railing? : A Lot 6 Click Score: 15    End of Session Equipment Utilized During Treatment: Gait belt Activity Tolerance: Patient tolerated treatment well Patient left: in chair;with call bell/phone within reach;with chair alarm set Nurse Communication: Mobility status PT Visit Diagnosis: Unsteadiness on feet (R26.81);Pain Pain - Right/Left: Left Pain - part of body: Leg    Time: 8883-8848 PT Time Calculation (min) (ACUTE ONLY): 35 min   Charges:   PT Evaluation $PT Eval Moderate Complexity: 1 Mod   PT General Charges $$ ACUTE PT VISIT: 1 Visit  Leita Sable, PT, DPT Acute Rehabilitation Services Secure Chat Preferred Office: [phone]   Leita JONETTA Sable 01/08/2024, 4:18 PM

## 2024-01-09 LAB — CBC
HCT: 26.8 % — ABNORMAL LOW (ref 36.0–46.0)
Hemoglobin: 9 g/dL — ABNORMAL LOW (ref 12.0–15.0)
MCH: 33.7 pg (ref 26.0–34.0)
MCHC: 33.6 g/dL (ref 30.0–36.0)
MCV: 100.4 fL — ABNORMAL HIGH (ref 80.0–100.0)
Platelets: 288 10*3/uL (ref 150–400)
RBC: 2.67 MIL/uL — ABNORMAL LOW (ref 3.87–5.11)
RDW: 13.4 % (ref 11.5–15.5)
WBC: 10.8 10*3/uL — ABNORMAL HIGH (ref 4.0–10.5)
nRBC: 0 % (ref 0.0–0.2)

## 2024-01-09 MED ORDER — SORBITOL 70 % SOLN
30.0000 mL | Freq: Once | Status: AC
  Administered 2024-01-09: 30 mL via ORAL
  Filled 2024-01-09: qty 30

## 2024-01-09 MED ORDER — BISACODYL 5 MG PO TBEC: 5.0000 mg | DELAYED_RELEASE_TABLET | Freq: Every day | ORAL | Status: DC | PRN

## 2024-01-09 NOTE — Progress Notes (Signed)
 Occupational Therapy Treatment Patient Details Name: Melinda Harrison MRN: [id number] DOB: June 15, 1966 Today's Date: 01/09/2024   History of present illness Pt is a 58 y.o. female admitted after fall s/p L ORIF distal femur fx.  PMH includes L tibial ORIF 4/29, HTN, COPD, DVT, PAD, lymphedema, asthma, IBS, PTSD, anxiety, depression.   OT comments  Pt feeling a little drowsy from pain meds prior to session, has some pain with movement. Co-treat with PT today, attempted to complete stairs. Pt min A x2 for safety to power STS and CGA for taking steps with RW. Pt will have 4 stairs with L hand rail to get up into home. Attempted to bump up, but Pt unable to maintain NWB precautions while standing. Instructed Pt to sit and scoot up stairs but Pt stated she was too drowsy/painful to complete.  Went over 2 person w/c transfer up stairs, Pt states he husband and friends will be able to assist her into her home with a w/c.  Pt would benefit from a wheelchair at home to maximize independence with mobility and safety within home, also allow for safe transfers in/out of house. HHOT still appropriate, will continue to see acutely to maximize progress.       If plan is discharge home, recommend the following:  A lot of help with walking and/or transfers;A little help with bathing/dressing/bathroom;Assistance with cooking/housework;Assist for transportation;Help with stairs or ramp for entrance   Equipment Recommendations  Wheelchair (measurements OT);Wheelchair cushion (measurements OT)    Recommendations for Other Services      Precautions / Restrictions Precautions Precautions: Fall Restrictions Weight Bearing Restrictions Per Provider Order: Yes LLE Weight Bearing Per Provider Order: Non weight bearing       Mobility Bed Mobility Overal bed mobility: Needs Assistance Bed Mobility: Supine to Sit           General bed mobility comments: arrived sitting EOB    Transfers Overall transfer  level: Needs assistance Equipment used: Rolling walker (2 wheels) Transfers: Sit to/from Stand, Bed to chair/wheelchair/BSC Sit to Stand: Min assist, +2 physical assistance     Step pivot transfers: Contact guard assist, +2 safety/equipment     General transfer comment: min A x2 for safety to power STS, CGA for taking steps to recliner     Balance Overall balance assessment: Needs assistance Sitting-balance support: No upper extremity supported, Feet supported Sitting balance-Leahy Scale: Good     Standing balance support: No upper extremity supported, During functional activity Standing balance-Leahy Scale: Poor Standing balance comment: reliant on BUEs with RW for support                           ADL either performed or assessed with clinical judgement   ADL Overall ADL's : Needs assistance/impaired                         Toilet Transfer: Minimal assistance;+2 for safety/equipment;Rolling walker (2 wheels)           Functional mobility during ADLs: Minimal assistance;+2 for safety/equipment;Rolling walker (2 wheels) General ADL Comments: Pt min A to power STS, CGA for taking steps with RW    Extremity/Trunk Assessment Upper Extremity Assessment Upper Extremity Assessment: Overall WFL for tasks assessed            Vision       Perception     Praxis      Cognition Arousal: Alert Behavior  During Therapy: WFL for tasks assessed/performed Overall Cognitive Status: History of cognitive impairments - at baseline                                 General Comments: history of multiple concussions, Pt states memory and word finding have been difficult for years.        Exercises      Shoulder Instructions       General Comments VSS on RA, pt reported feeling drowsy after pain meds. Did not improve through session and requested to return to bed. Attempted stair negotiation, however, pt was unable to hop with B UE on L rail     Pertinent Vitals/ Pain       Pain Assessment Pain Assessment: Faces Faces Pain Scale: Hurts even more Pain Location: L leg, back Pain Descriptors / Indicators: Aching Pain Intervention(s): Monitored during session  Home Living                                          Prior Functioning/Environment              Frequency  Min 1X/week        Progress Toward Goals  OT Goals(current goals can now be found in the care plan section)  Progress towards OT goals: Progressing toward goals  Acute Rehab OT Goals Patient Stated Goal: to return home OT Goal Formulation: With patient Time For Goal Achievement: 01/22/24 Potential to Achieve Goals: Good ADL Goals Pt Will Transfer to Toilet: with supervision Pt Will Perform Toileting - Clothing Manipulation and hygiene: with modified independence Additional ADL Goal #1: Pt will be able to complete bed mobility in/out of bed as needed mod I to maximize independence and return to PLOF  Plan      Co-evaluation    PT/OT/SLP Co-Evaluation/Treatment: Yes Reason for Co-Treatment: For patient/therapist safety;To address functional/ADL transfers PT goals addressed during session: Mobility/safety with mobility;Balance;Proper use of DME;Strengthening/ROM OT goals addressed during session: ADL's and self-care;Proper use of Adaptive equipment and DME      AM-PAC OT "6 Clicks" Daily Activity     Outcome Measure   Help from another person eating meals?: None Help from another person taking care of personal grooming?: A Little Help from another person toileting, which includes using toliet, bedpan, or urinal?: A Little Help from another person bathing (including washing, rinsing, drying)?: A Lot Help from another person to put on and taking off regular upper body clothing?: A Little Help from another person to put on and taking off regular lower body clothing?: A Lot 6 Click Score: 17    End of Session Equipment  Utilized During Treatment: Gait belt;Rolling walker (2 wheels)  OT Visit Diagnosis: Unsteadiness on feet (R26.81);Other abnormalities of gait and mobility (R26.89);Muscle weakness (generalized) (M62.81);Pain;History of falling (Z91.81) Pain - Right/Left: Left Pain - part of body: Leg   Activity Tolerance Patient tolerated treatment well;Patient limited by pain   Patient Left in bed;with call bell/phone within reach;with bed alarm set   Nurse Communication Mobility status        Time: 2202-5427 OT Time Calculation (min): 25 min  Charges: OT General Charges $OT Visit: 1 Visit OT Treatments $Therapeutic Activity: 8-22 mins  [place], OTR/L   Scherry Curtis 01/09/2024, 4:20 PM

## 2024-01-09 NOTE — Plan of Care (Signed)
   Problem: Activity: Goal: Risk for activity intolerance will decrease Outcome: Progressing

## 2024-01-09 NOTE — Progress Notes (Signed)
 Orthopaedic Trauma Progress Note  SUBJECTIVE: Doing ok today. Pain better controlled compared to yesterday. Was able to mobilize to bathroom and bedside chair with therapies. No chest pain. No SOB. No nausea/vomiting. No other complaints.  Tolerating diet and fluids. No BM since admission, will add sorbitol  dose today and Dulcolax daily PRN.  OBJECTIVE:  Vitals:   01/09/24 0751 01/09/24 1359  BP: (!) 100/52 (!) 111/52  Pulse: 82 (!) 105  Resp: 16 16  Temp: 98.2 F (36.8 C) 98.2 F (36.8 C)  SpO2: 98% 98%    General: Sitting up in bedside chair, no acute distress.  Pleasant and cooperative Respiratory: No increased work of breathing. Left lower extremity: Dressings clean, dry, intact.  Soreness throughout the distal thigh as expected.  Some soreness to the calf but no areas of significant tenderness.  Tolerates gentle ankle dorsiflexion/plantarflexion.  Able to wiggle the toes.  Endorses sensation to all aspects of the foot.  2+ DP pulse.  IMAGING: Stable post op imaging.   LABS:  Results for orders placed or performed during the hospital encounter of 01/04/24 (from the past 24 hours)  CBC     Status: Abnormal   Collection Time: 01/09/24  6:53 AM  Result Value Ref Range   WBC 10.8 (H) 4.0 - 10.5 K/uL   RBC 2.67 (L) 3.87 - 5.11 MIL/uL   Hemoglobin 9.0 (L) 12.0 - 15.0 g/dL   HCT 14.7 (L) 82.9 - 56.2 %   MCV 100.4 (H) 80.0 - 100.0 fL   MCH 33.7 26.0 - 34.0 pg   MCHC 33.6 30.0 - 36.0 g/dL   RDW 13.0 86.5 - 78.4 %   Platelets 288 150 - 400 K/uL   nRBC 0.0 0.0 - 0.2 %    ASSESSMENT: Melinda Harrison is a 58 y.o. female, 2 Days Post-Op s/p OPEN REDUCTION INTERNAL FIXATION LEFT DISTAL FEMUR FRACTURE  CV/Blood loss: Acute blood loss anemia, Hgb 9.0 this morning. Hemodynamically stable  PLAN: Weightbearing: NWB LLE ROM: Okay for knee and ankle motion as tolerated Incisional and dressing care: Change dressings PRN. Ok to leave incisions open to air Showering: Patient okay to begin  showering/getting incisions wet 01/10/2024. Orthopedic device(s): None  Pain management:  1. Tylenol  1000 mg q 6 hours scheduled 2. Robaxin  500 mg QID 3. Oxycodone  5-15 mg q 4 hours PRN 4. Dilaudid  0.5-1 mg q 3 hours PRN VTE prophylaxis: Lovenox , SCDs ID:  Ancef  2gm post op completed Foley/Lines:  No foley, KVO IVFs Impediments to Fracture Healing: Vitamin D  level 73 when checked in April 2024, continue home dose supplementation Dispo: PT/OT eval ongoing. Recommending HH. Plan for d/c home tomorrow 01/10/24  D/C recommendations: -Oxycodone , Tylenol , and Robaxin  for pain control -Eliquis  2.5 mg twice daily x 30 days for DVT prophylaxis -Continue home dose Vit D supplementation  Follow - up plan: 2 weeks after discharge for wound check and repeat x-rays   Contact information:  Katheryne Pane MD, Alona Jamaica PA-C. After hours and holidays please check [email or web address] for group call information for [facility]   Edilia Gordon, PA-C [phone] (office) [email or web address]

## 2024-01-09 NOTE — Progress Notes (Addendum)
 Physical Therapy Treatment Patient Details Name: Melinda Harrison MRN: [id number] DOB: January 16, 1966 Today's Date: 01/09/2024   History of Present Illness Pt is a 58 y.o. female admitted after fall s/p L ORIF distal femur fx.  PMH includes L tibial ORIF 4/29, HTN, COPD, DVT, PAD, lymphedema, asthma, IBS, PTSD, anxiety, depression.    PT Comments  Pt in bed upon arrival and agreeable to PT session. Worked on gait training in today's session. Pt was limited in today's session by feeling drowsy after taking pain meds. Pt attempted stair negotiation, however, pt was unable to push with B UE on rail and maintain WB precautions. Discussed either bumping up the steps on bottom or having at least two family members assist with bumping a WC up the steps. Pt reported she would have at least four people available upon d/c to assist with stairs. Educated pt on WC bump with handout given. Acute PT to follow.      If plan is discharge home, recommend the following: A little help with walking and/or transfers;A little help with bathing/dressing/bathroom;Assistance with cooking/housework;Assist for transportation;Help with stairs or ramp for entrance   Can travel by private vehicle      Yes  Equipment Recommendations  Wheelchair (measurements PT);Wheelchair cushion (measurements PT)       Precautions / Restrictions Precautions Precautions: Fall Restrictions Weight Bearing Restrictions Per Provider Order: Yes LLE Weight Bearing Per Provider Order: Non weight bearing     Mobility  Bed Mobility         Supine to sit: Min assist, HOB elevated, Used rails Sit to supine: Mod assist        Transfers                 Ambulation/Gait Ambulation/Gait assistance: Contact guard assist Gait Distance (Feet): 10 Feet (x3) Assistive device: Rolling walker (2 wheels) Gait Pattern/deviations: Decreased stride length, Step-to pattern Gait velocity: Decreased     General Gait Details: CGA for safety        Balance                     Cognition              General Comments General comments (skin integrity, edema, etc.): VSS on RA, pt reported feeling drowsy after pain meds. Did not improve through session and requested to return to bed. Attempted stair negotiation, however, pt was unable to hop with B UE on L rail      Pertinent Vitals/Pain       PT Goals (current goals can now be found in the care plan section) Acute Rehab PT Goals Patient Stated Goal: Be able to return home PT Goal Formulation: With patient Time For Goal Achievement: 01/22/24 Potential to Achieve Goals: Good Progress towards PT goals: Progressing toward goals    Frequency    Min 1X/week           Co-evaluation   Reason for Co-Treatment: For patient/therapist safety;To address functional/ADL transfers PT goals addressed during session: Mobility/safety with mobility;Balance;Proper use of DME;Strengthening/ROM OT goals addressed during session: ADL's and self-care;Proper use of Adaptive equipment and DME      AM-PAC PT "6 Clicks" Mobility   Outcome Measure  Help needed turning from your back to your side while in a flat bed without using bedrails?: A Little Help needed moving from lying on your back to sitting on the side of a flat bed without using bedrails?: A Little Help needed moving to  and from a bed to a chair (including a wheelchair)?: A Little Help needed standing up from a chair using your arms (e.g., wheelchair or bedside chair)?: A Little Help needed to walk in hospital room?: A Little Help needed climbing 3-5 steps with a railing? : Total 6 Click Score: 16    End of Session Equipment Utilized During Treatment: Gait belt Activity Tolerance: Patient limited by fatigue (drowsiness) Patient left: with call bell/phone within reach;in bed;with bed alarm set Nurse Communication: Mobility status PT Visit Diagnosis: Unsteadiness on feet (R26.81);Pain Pain - Right/Left:  Left Pain - part of body: Leg     Time: 8119-1478 PT Time Calculation (min) (ACUTE ONLY): 27 min  Charges:    $Gait Training: 8-22 mins PT General Charges $$ ACUTE PT VISIT: 1 Visit                     Orysia Blas, PT, DPT Secure Chat Preferred  Rehab Office [phone]    Alissa April Adela Ades 01/09/2024, 4:20 PM

## 2024-01-10 LAB — CBC
HCT: 29.1 % — ABNORMAL LOW (ref 36.0–46.0)
Hemoglobin: 9.8 g/dL — ABNORMAL LOW (ref 12.0–15.0)
MCH: 33.8 pg (ref 26.0–34.0)
MCHC: 33.7 g/dL (ref 30.0–36.0)
MCV: 100.3 fL — ABNORMAL HIGH (ref 80.0–100.0)
Platelets: 347 10*3/uL (ref 150–400)
RBC: 2.9 MIL/uL — ABNORMAL LOW (ref 3.87–5.11)
RDW: 13.3 % (ref 11.5–15.5)
WBC: 9.7 10*3/uL (ref 4.0–10.5)
nRBC: 0 % (ref 0.0–0.2)

## 2024-01-10 MED ORDER — SORBITOL 70 % SOLN
30.0000 mL | Freq: Once | Status: AC
  Administered 2024-01-10: 30 mL via ORAL
  Filled 2024-01-10: qty 30

## 2024-01-10 MED ORDER — POLYETHYLENE GLYCOL 3350 17 G PO PACK
17.0000 g | PACK | Freq: Every day | ORAL | Status: DC
  Administered 2024-01-10: 17 g via ORAL
  Filled 2024-01-10: qty 1

## 2024-01-10 MED ORDER — OXYCODONE HCL 15 MG PO TABS: 15.0000 mg | ORAL_TABLET | ORAL | 0 refills | Status: AC | PRN

## 2024-01-10 MED ORDER — METHOCARBAMOL 500 MG PO TABS: 500.0000 mg | ORAL_TABLET | Freq: Four times a day (QID) | ORAL | 0 refills | Status: AC

## 2024-01-10 MED ORDER — APIXABAN 2.5 MG PO TABS: 2.5000 mg | ORAL_TABLET | Freq: Two times a day (BID) | ORAL | 0 refills | Status: AC

## 2024-01-10 NOTE — Progress Notes (Signed)
    Durable Medical Equipment  (From admission, onward)           Start     Ordered   01/10/24 1600  For home use only DME Bedside commode  Once       Comments: Confine to one room  Question Answer Comment  Patient needs a bedside commode to treat with the following condition Closed fracture of left distal femur [facility])   Patient needs a bedside commode to treat with the following condition Weakness   Patient needs a bedside commode to treat with the following condition Limited mobility      01/10/24 1601

## 2024-01-10 NOTE — Plan of Care (Signed)
  Problem: Education: Goal: Knowledge of General Education information will improve Description: Including pain rating scale, medication(s)/side effects and non-pharmacologic comfort measures 01/10/2024 1518 by Genevie Ann, RN Outcome: Progressing 01/10/2024 1517 by Genevie Ann, RN Outcome: Progressing

## 2024-01-10 NOTE — Plan of Care (Signed)
  Problem: Education: Goal: Knowledge of General Education information will improve Description: Including pain rating scale, medication(s)/side effects and non-pharmacologic comfort measures 01/10/2024 1552 by Genevie Ann, RN Outcome: Adequate for Discharge 01/10/2024 1518 by Genevie Ann, RN Outcome: Progressing 01/10/2024 1517 by Genevie Ann, RN Outcome: Progressing   Problem: Health Behavior/Discharge Planning: Goal: Ability to manage health-related needs will improve Outcome: Adequate for Discharge   Problem: Clinical Measurements: Goal: Ability to maintain clinical measurements within normal limits will improve Outcome: Adequate for Discharge Goal: Will remain free from infection Outcome: Adequate for Discharge Goal: Diagnostic test results will improve Outcome: Adequate for Discharge Goal: Respiratory complications will improve Outcome: Adequate for Discharge Goal: Cardiovascular complication will be avoided Outcome: Adequate for Discharge   Problem: Activity: Goal: Risk for activity intolerance will decrease Outcome: Adequate for Discharge   Problem: Nutrition: Goal: Adequate nutrition will be maintained Outcome: Adequate for Discharge   Problem: Coping: Goal: Level of anxiety will decrease Outcome: Adequate for Discharge   Problem: Elimination: Goal: Will not experience complications related to bowel motility Outcome: Adequate for Discharge Goal: Will not experience complications related to urinary retention Outcome: Adequate for Discharge   Problem: Pain Management: Goal: General experience of comfort will improve Outcome: Adequate for Discharge   Problem: Safety: Goal: Ability to remain free from injury will improve Outcome: Adequate for Discharge   Problem: Skin Integrity: Goal: Risk for impaired skin integrity will decrease Outcome: Adequate for Discharge

## 2024-01-10 NOTE — Progress Notes (Signed)
Discharge instructions, RX's and follow up appt explained and provided to patient and c/g verbalized understanding. Patient left floor via wheelchair accompanied by staff. TOC set up home health and DME.  Dene Nazir, Kae Heller, RN

## 2024-01-10 NOTE — Discharge Instructions (Signed)
Orthopaedic Trauma Service Discharge Instructions   General Discharge Instructions  WEIGHT BEARING STATUS:Non-weightbearing left lower extremity  RANGE OF MOTION/ACTIVITY: Ok for knee motion as tolerated  Wound Care: You may remove your surgical dressing on Thursday 01/10/2024. Incisions can be left open to air if there is no drainage. Once the incision is completely dry and without drainage, it may be left open to air out.  Showering may begin Friday 01/11/2024.  Clean incision gently with soap and water.  DVT/PE prophylaxis:  Eliquis 2.5 mg twice daily  Diet: as you were eating previously.  Can use over the counter stool softeners and bowel preparations, such as Miralax, to help with bowel movements.  Narcotics can be constipating.  Be sure to drink plenty of fluids  PAIN MEDICATION USE AND EXPECTATIONS  You have likely been given narcotic medications to help control your pain.  After a traumatic event that results in an fracture (broken bone) with or without surgery, it is ok to use narcotic pain medications to help control one's pain.  We understand that everyone responds to pain differently and each individual patient will be evaluated on a regular basis for the continued need for narcotic medications. Ideally, narcotic medication use should last no more than 6-8 weeks (coinciding with fracture healing).   As a patient it is your responsibility as well to monitor narcotic medication use and report the amount and frequency you use these medications when you come to your office visit.   We would also advise that if you are using narcotic medications, you should take a dose prior to therapy to maximize you participation.  IF YOU ARE ON NARCOTIC MEDICATIONS IT IS NOT PERMISSIBLE TO OPERATE A MOTOR VEHICLE (MOTORCYCLE/CAR/TRUCK/MOPED) OR HEAVY MACHINERY DO NOT MIX NARCOTICS WITH OTHER CNS (CENTRAL NERVOUS SYSTEM) DEPRESSANTS SUCH AS ALCOHOL   STOP SMOKING OR USING NICOTINE PRODUCTS!!!!  As  discussed nicotine severely impairs your body's ability to heal surgical and traumatic wounds but also impairs bone healing.  Wounds and bone heal by forming microscopic blood vessels (angiogenesis) and nicotine is a vasoconstrictor (essentially, shrinks blood vessels).  Therefore, if vasoconstriction occurs to these microscopic blood vessels they essentially disappear and are unable to deliver necessary nutrients to the healing tissue.  This is one modifiable factor that you can do to dramatically increase your chances of healing your injury.    (This means no smoking, no nicotine gum, patches, etc)  DO NOT USE NONSTEROIDAL ANTI-INFLAMMATORY DRUGS (NSAID'S)  Using products such as Advil (ibuprofen), Aleve (naproxen), Motrin (ibuprofen) for additional pain control during fracture healing can delay and/or prevent the healing response.  If you would like to take over the counter (OTC) medication, Tylenol (acetaminophen) is ok.  However, some narcotic medications that are given for pain control contain acetaminophen as well. Therefore, you should not exceed more than 4000 mg of tylenol in a day if you do not have liver disease.  Also note that there are may OTC medicines, such as cold medicines and allergy medicines that my contain tylenol as well.  If you have any questions about medications and/or interactions please ask your doctor/PA or your pharmacist.      ICE AND ELEVATE INJURED/OPERATIVE EXTREMITY  Using ice and elevating the injured extremity above your heart can help with swelling and pain control.  Icing in a pulsatile fashion, such as 20 minutes on and 20 minutes off, can be followed.    Do not place ice directly on skin. Make sure there  is a barrier between to skin and the ice pack.    Using frozen items such as frozen peas works well as the conform nicely to the are that needs to be iced.  USE AN ACE WRAP OR TED HOSE FOR SWELLING CONTROL  In addition to icing and elevation, Ace wraps or TED  hose are used to help limit and resolve swelling.  It is recommended to use Ace wraps or TED hose until you are informed to stop.    When using Ace Wraps start the wrapping distally (farthest away from the body) and wrap proximally (closer to the body)   Example: If you had surgery on your leg or thing and you do not have a splint on, start the ace wrap at the toes and work your way up to the thigh        If you had surgery on your upper extremity and do not have a splint on, start the ace wrap at your fingers and work your way up to the upper arm   CALL THE OFFICE FOR MEDICATION REFILL OR WITH ANY QUESTIONS/CONCERNS: [phone]   VISIT OUR WEBSITE FOR ADDITIONAL INFORMATION: [email or web address]     Discharge Wound Care Instructions  Do NOT apply any ointments, solutions or lotions to pin sites or surgical wounds.  These prevent needed drainage and even though solutions like hydrogen peroxide kill bacteria, they also damage cells lining the pin sites that help fight infection.  Applying lotions or ointments can keep the wounds moist and can cause them to breakdown and open up as well. This can increase the risk for infection. When in doubt call the office.  Surgical incisions should be dressed daily.  If any drainage is noted, use one layer of adaptic or Mepitel, then gauze, Kerlix, and an ace wrap. - These dressing supplies should be available at local medical supply stores [facility], etc) as well as Insurance claims handler ([facility], [facility], [place], etc)  Once the incision is completely dry and without drainage, it may be left open to air out.  Showering may begin 36-48 hours later.  Cleaning gently with soap and water.  Traumatic wounds should be dressed daily as well.    One layer of adaptic, gauze, Kerlix, then ace wrap.  The adaptic can be discontinued once the draining has ceased    If you have a wet to dry dressing: wet the gauze with saline the squeeze as much saline  out so the gauze is moist (not soaking wet), place moistened gauze over wound, then place a dry gauze over the moist one, followed by Kerlix wrap, then ace wrap.

## 2024-01-10 NOTE — Progress Notes (Signed)
Physical Therapy Treatment Patient Details Name: Melinda Harrison MRN: [id number] DOB: February 25, 1966 Today's Date: 01/10/2024   History of Present Illness Pt is a 58 y.o. female admitted after fall s/p L ORIF distal femur fx.  PMH includes L tibial ORIF 4/29, HTN, COPD, DVT, PAD, lymphedema, asthma, IBS, PTSD, anxiety, depression.    PT Comments  Pt in bed upon arrival with daughter and father present and agreeable to PT session. Worked on gait training and LE strength in today's session. Pt improved in ability to ambulate ~80 ft with RW. Gave pt LE HEP with good demonstration. Pt required AAROM for L SLR and hip ABD. Daughter was present and supportive throughout session. Pt did not want to attempt stair negotiation and feels comfortable utilizing WC bump with family members. Pt is progressing well towards goals. Acute PT to follow.      If plan is discharge home, recommend the following: A little help with walking and/or transfers;A little help with bathing/dressing/bathroom;Assistance with cooking/housework;Assist for transportation;Help with stairs or ramp for entrance   Can travel by private vehicle     Yes   Equipment Recommendations  Wheelchair (measurements PT);Wheelchair cushion (measurements PT)       Precautions / Restrictions Precautions Precautions: Fall Restrictions Weight Bearing Restrictions Per Provider Order: Yes LLE Weight Bearing Per Provider Order: Non weight bearing     Mobility  Bed Mobility Overal bed mobility: Needs Assistance Bed Mobility: Supine to Sit     Supine to sit: Min assist, HOB elevated, Used rails Sit to supine: Min assist   General bed mobility comments: MinA for LE management    Transfers Overall transfer level: Needs assistance Equipment used: Rolling walker (2 wheels) Transfers: Sit to/from Stand Sit to Stand: Contact guard assist     General transfer comment: CGA for safety    Ambulation/Gait Ambulation/Gait assistance: Contact  guard assist Gait Distance (Feet): 60 Feet Assistive device: Rolling walker (2 wheels) Gait Pattern/deviations: Decreased stride length, Step-to pattern Gait velocity: Decreased    General Gait Details: CGA for safety     Balance Overall balance assessment: Needs assistance Sitting-balance support: No upper extremity supported, Feet supported Sitting balance-Leahy Scale: Good     Standing balance support: No upper extremity supported, During functional activity Standing balance-Leahy Scale: Poor Standing balance comment: reliant on BUEs with RW for support       Cognition Arousal: Alert Behavior During Therapy: WFL for tasks assessed/performed Overall Cognitive Status: History of cognitive impairments - at baseline       Exercises General Exercises - Lower Extremity Ankle Circles/Pumps: AROM, Left, 10 reps, Seated Quad Sets: AROM, Left, 5 reps, Supine Long Arc Quad: AROM, Left, 10 reps, Seated Hip ABduction/ADduction: AAROM, Left, 5 reps, Supine Straight Leg Raises: AAROM, Left, 5 reps, Supine Hip Flexion/Marching: AROM, Left, 10 reps, Seated    General Comments General comments (skin integrity, edema, etc.): VSS on RA      Pertinent Vitals/Pain Pain Assessment Pain Assessment: Faces Faces Pain Scale: Hurts even more Pain Location: L leg, back Pain Descriptors / Indicators: Aching Pain Intervention(s): Limited activity within patient's tolerance, Monitored during session, Repositioned     PT Goals (current goals can now be found in the care plan section) Acute Rehab PT Goals Patient Stated Goal: Be able to return home PT Goal Formulation: With patient Time For Goal Achievement: 01/22/24 Potential to Achieve Goals: Good Progress towards PT goals: Progressing toward goals    Frequency    Min 1X/week  AM-PAC PT "6 Clicks" Mobility   Outcome Measure  Help needed turning from your back to your side while in a flat bed without using bedrails?: A  Little Help needed moving from lying on your back to sitting on the side of a flat bed without using bedrails?: A Little Help needed moving to and from a bed to a chair (including a wheelchair)?: A Little Help needed standing up from a chair using your arms (e.g., wheelchair or bedside chair)?: A Little Help needed to walk in hospital room?: A Little Help needed climbing 3-5 steps with a railing? : Total 6 Click Score: 16    End of Session Equipment Utilized During Treatment: Gait belt Activity Tolerance: Patient tolerated treatment well Patient left: with call bell/phone within reach;in bed;with bed alarm set;with family/visitor present Nurse Communication: Mobility status PT Visit Diagnosis: Unsteadiness on feet (R26.81);Pain Pain - Right/Left: Left Pain - part of body: Leg     Time: 1340-1403 PT Time Calculation (min) (ACUTE ONLY): 23 min  Charges:    $Gait Training: 8-22 mins $Therapeutic Exercise: 8-22 mins PT General Charges $$ ACUTE PT VISIT: 1 Visit                     Hilton Cork, PT, DPT Secure Chat Preferred  Rehab Office [phone]   Arturo Morton Brion Aliment 01/10/2024, 2:33 PM

## 2024-01-10 NOTE — TOC Transition Note (Signed)
Transition of Care [facility]) - Discharge Note   Patient Details  Name: Melinda Harrison MRN: [id number] Date of Birth: [birth year]  Transition of Care [facility]) CM/SW Contact:  Epifanio Lesches, RN Phone Number: 01/10/2024, 4:14 PM   Clinical Narrative:    Patient will DC to: home Anticipated DC date: 01/10/2024 Family notified: yes Transport by: car  Per MD patient ready for DC to . RN, patient, patient's family, and [facility] notified of DC.   Referral made with [facility] for [facility]. Equipment will be drop shipped to pt's home in1-2 days, pt aware. Husband to provide transportation to home. Pt without RX med concerns. Post hospital f/u noted on AVS.  RNCM will sign off for now as intervention is no longer needed. Please consult [place] again if new needs arise.     Barriers to Discharge: Continued Medical Work up   Patient Goals and CMS Choice     Choice offered to / list presented to : Patient      Discharge Placement                       Discharge Plan and Services Additional resources added to the After Visit Summary for     Discharge Planning Services: CM Consult            DME Arranged: Bedside commode DME Agency: [facility] Date DME Agency Contacted: 01/10/24 Time DME Agency Contacted: 1608 Representative spoke with at DME Agency: Ian Malkin HH Arranged: PT HH Agency: [facility] Date [facility] Agency Contacted: 01/08/24 Time HH Agency Contacted: 1201 Representative spoke with at [facility] Agency: Tresa Endo  Social Drivers of Health (SDOH) Interventions SDOH Screenings   Food Insecurity: No Food Insecurity (01/05/2024)  Housing: Low Risk  (01/05/2024)  Transportation Needs: No Transportation Needs (01/05/2024)  Utilities: Not At Risk (01/05/2024)  Financial Resource Strain: Low Risk  (12/28/2023)   Received from [facility]  Tobacco Use: Low Risk  (01/07/2024)     Readmission Risk Interventions     No data to display

## 2024-01-10 NOTE — Plan of Care (Signed)
  Problem: Education: Goal: Knowledge of General Education information will improve Description Including pain rating scale, medication(s)/side effects and non-pharmacologic comfort measures Outcome: Progressing   

## 2024-01-10 NOTE — Progress Notes (Addendum)
Orthopaedic Trauma Progress Note  SUBJECTIVE: Doing fairly well this morning.  Her daughter assisted her in getting a bath earlier today which patient states felt really good.  Was able to mobilize some with therapies yesterday but had difficulty navigating stairs.  Had recently received pain medication prior to this therapy session, noted to be drowsy but is hopeful she can work on this again today.  Pain in the left leg improving.  No chest pain. No SOB. No nausea/vomiting. No other complaints.  Tolerating diet and fluids. No BM since admission but has been passing gas.  Feels like she could have a BM today.  Will order an additional dose of sorbitol and schedule her MiraLAX.  Patient's daughter at bedside.  Patient has a walker, crutches, wheelchair available at discharge.  Is requesting a bedside commode.  OBJECTIVE:  Vitals:   01/10/24 0327 01/10/24 0756  BP: (!) 106/48 (!) 111/57  Pulse: 84 80  Resp: 16 18  Temp: 98.6 F (37 C) 98.1 F (36.7 C)  SpO2: 97% 99%    General: Sitting up in bed, no acute distress.  Pleasant and cooperative Respiratory: No increased work of breathing. Left lower extremity: Mepilex dressings clean, dry, intact.  Soreness throughout the distal thigh as expected.  Some soreness to the calf but no areas of significant tenderness.  Tolerates gentle ankle dorsiflexion/plantarflexion.  Able to wiggle the toes.  Endorses sensation to all aspects of the foot.  2+ DP pulse.  IMAGING: Stable post op imaging.   LABS:  No results found for this or any previous visit (from the past 24 hours).   ASSESSMENT: Melinda Harrison is a 58 y.o. female, 3 Days Post-Op s/p OPEN REDUCTION INTERNAL FIXATION LEFT DISTAL FEMUR FRACTURE  CV/Blood loss: Acute blood loss anemia, Hgb 9.0 on 01/09/2024.  Will recheck CBC this afternoon.  Hemodynamically stable  PLAN: Weightbearing: NWB LLE ROM: Okay for knee and ankle motion as tolerated Incisional and dressing care: Change dressings  PRN. Ok to leave incisions open to air Showering: Patient okay to begin showering/getting incisions wet 01/10/2024. Orthopedic device(s): None  Pain management:  1. Tylenol 1000 mg q 6 hours scheduled 2. Robaxin 500 mg QID 3. Oxycodone 5-15 mg q 4 hours PRN 4. Dilaudid 0.5-1 mg q 3 hours PRN VTE prophylaxis: Eliquis, SCDs ID:  Ancef 2gm post op completed Foley/Lines:  No foley, KVO IVFs Impediments to Fracture Healing: Vitamin D level 73 when checked in April 2024, continue home dose supplementation Dispo: PT/OT eval ongoing. Recommending HH.  Would like for patient to work on navigating stairs today.  Discharge home later today versus tomorrow 01/11/2024 pending progress with therapies  D/C recommendations: -Oxycodone, Tylenol, and Robaxin for pain control -Eliquis 2.5 mg twice daily x 30 days for DVT prophylaxis -Continue home dose Vit D supplementation  Follow - up plan: 2 weeks after discharge for wound check and repeat x-rays   Contact information:  Truitt Merle MD, Thyra Breed PA-C. After hours and holidays please check [email or web address] for group call information for [facility]   Thompson Caul, PA-C [phone] (office) [email or web address]

## 2024-01-14 NOTE — Discharge Summary (Signed)
Orthopaedic Trauma Service (OTS) Discharge Summary   Patient ID: Melinda Harrison MRN: [id number] DOB/AGE: 07/07/1966 58 y.o.  Admit date: 01/04/2024 Discharge date: 01/10/2024  Admission Diagnoses: Left distal femur fracture  Discharge Diagnoses:  Principal Problem:   Closed fracture of left femur (HCC)   Past Medical History:  Diagnosis Date   Anemia    H/O   Anxiety    Arthritis    Asthma    Back pain    COPD (chronic obstructive pulmonary disease) (HCC)    Depression    GERD (gastroesophageal reflux disease)    H/O PRIOR TO GASTRIC BYPASS   Headache    History of DVT (deep vein thrombosis)    Occurred in 2017   History of hiatal hernia    H/O   History of kidney stones    H/O   Hypertension    H/O -NO MEDS SINCE HAVING GASTRIC BYPASS   IBS (irritable bowel syndrome)    PAD (peripheral artery disease) (HCC)    PTSD (post-traumatic stress disorder)    PTSD (post-traumatic stress disorder)      Procedures Performed: ORIF left distal femur  Discharged Condition: good/stable  Hospital Course: Patient presented to the [facility] emergency department on 01/04/2024 after sustaining a fall, landing on the left leg.  Patient found to have a left distal femur fracture.  Orthopedics consulted for evaluation and management.  Patient admitted to the orthopedic service for surgical planning.  Due to the timing of patient's fall and weekend/surgeon availability, surgery was delayed until 01/07/2024.  Patient taken to the operating room by Dr. Jena Gauss on 01/07/2024 for the above procedure.  She tolerated this well without complications.  Was instructed to be nonweightbearing left lower extremity postoperatively.  Began working with physical and Occupational Therapy starting on postoperative day 1.  Will start on Lovenox for DVT prophylaxis starting on postoperative day #1.  Remainder of patient's hospitalization here to treatment adequate pain control and increasing mobility in  order for patient to safely return home. On 01/10/2024, the patient was tolerating diet, working well with therapies, pain well controlled, vital signs stable, dressings clean, dry, intact and felt stable for discharge to home. Patient will follow up as below and knows to call with questions or concerns.     Consults: orthopedic surgery  Significant Diagnostic Studies:   Results for orders placed or performed during the hospital encounter of 01/04/24 (from the past week)  Basic metabolic panel   Collection Time: 01/08/24  5:13 AM  Result Value Ref Range   Sodium 138 135 - 145 mmol/L   Potassium 4.1 3.5 - 5.1 mmol/L   Chloride 100 98 - 111 mmol/L   CO2 28 22 - 32 mmol/L   Glucose, Bld 136 (H) 70 - 99 mg/dL   BUN 11 6 - 20 mg/dL   Creatinine, Ser 2.84 0.44 - 1.00 mg/dL   Calcium 8.9 8.9 - 13.2 mg/dL   GFR, Estimated >44 >01 mL/min   Anion gap 10 5 - 15  CBC   Collection Time: 01/08/24  5:13 AM  Result Value Ref Range   WBC 16.3 (H) 4.0 - 10.5 K/uL   RBC 3.10 (L) 3.87 - 5.11 MIL/uL   Hemoglobin 10.4 (L) 12.0 - 15.0 g/dL   HCT 02.7 (L) 25.3 - 66.4 %   MCV 97.4 80.0 - 100.0 fL   MCH 33.5 26.0 - 34.0 pg   MCHC 34.4 30.0 - 36.0 g/dL   RDW 40.3 47.4 - 25.9 %  Platelets 275 150 - 400 K/uL   nRBC 0.0 0.0 - 0.2 %  CBC   Collection Time: 01/09/24  6:53 AM  Result Value Ref Range   WBC 10.8 (H) 4.0 - 10.5 K/uL   RBC 2.67 (L) 3.87 - 5.11 MIL/uL   Hemoglobin 9.0 (L) 12.0 - 15.0 g/dL   HCT 96.2 (L) 95.2 - 84.1 %   MCV 100.4 (H) 80.0 - 100.0 fL   MCH 33.7 26.0 - 34.0 pg   MCHC 33.6 30.0 - 36.0 g/dL   RDW 32.4 40.1 - 02.7 %   Platelets 288 150 - 400 K/uL   nRBC 0.0 0.0 - 0.2 %  CBC   Collection Time: 01/10/24 12:33 PM  Result Value Ref Range   WBC 9.7 4.0 - 10.5 K/uL   RBC 2.90 (L) 3.87 - 5.11 MIL/uL   Hemoglobin 9.8 (L) 12.0 - 15.0 g/dL   HCT 25.3 (L) 66.4 - 40.3 %   MCV 100.3 (H) 80.0 - 100.0 fL   MCH 33.8 26.0 - 34.0 pg   MCHC 33.7 30.0 - 36.0 g/dL   RDW 47.4 25.9 - 56.3 %    Platelets 347 150 - 400 K/uL   nRBC 0.0 0.0 - 0.2 %     Treatments: IV hydration, antibiotics: Keflex, Macrobid, as above Ancef, analgesia: acetaminophen, Dilaudid, and oxycodone, cardiac meds: lisinopril (Zestril) and spironolactone, anticoagulation: LMW heparin, therapies: PT and OT, and surgery: As above  Discharge Exam: General: Sitting up in bed, no acute distress.  Pleasant and cooperative Respiratory: No increased work of breathing. Left lower extremity: Mepilex dressings clean, dry, intact.  Soreness throughout the distal thigh as expected.  Some soreness to the calf but no areas of significant tenderness.  Tolerates gentle ankle dorsiflexion/plantarflexion.  Able to wiggle the toes.  Endorses sensation to all aspects of the foot.  2+ DP pulse.    Disposition: Discharge disposition: 06-Home-Health Care Svc        Allergies as of 01/10/2024       Reactions   Tea Anaphylaxis   Egg-derived Products Swelling, Other (See Comments)   Lactose Intolerance (gi)    Tape Rash   PAPER TAPE OK TO USE        Medication List     STOP taking these medications    cephALEXin 250 MG capsule Commonly known as: KEFLEX   nitrofurantoin (macrocrystal-monohydrate) 100 MG capsule Commonly known as: MACROBID       TAKE these medications    acetaminophen 500 MG tablet Commonly known as: TYLENOL Take 500 mg by mouth every 6 (six) hours as needed for moderate pain, mild pain, headache or fever.   ALPRAZolam 0.25 MG tablet Commonly known as: XANAX Take 0.25 mg by mouth at bedtime as needed for anxiety.   amphetamine-dextroamphetamine 10 MG 24 hr capsule Commonly known as: ADDERALL XR Take 10 mg by mouth daily.   amphetamine-dextroamphetamine 10 MG tablet Commonly known as: ADDERALL Take 10 mg by mouth daily with breakfast.   apixaban 2.5 MG Tabs tablet Commonly known as: Eliquis Take 1 tablet (2.5 mg total) by mouth 2 (two) times daily.   Biotin 87564 MCG Tabs Take 1  tablet by mouth daily.   cetirizine 10 MG tablet Commonly known as: ZYRTEC Take 10 mg by mouth 2 (two) times daily.   DULoxetine 60 MG capsule Commonly known as: CYMBALTA Take 1 capsule (60 mg total) by mouth 2 (two) times daily. What changed: how much to take   EPIPEN IJ  Inject 0.3 mg as directed 3 times/day as needed-between meals & bedtime (anaphylaxis).   escitalopram 5 MG tablet Commonly known as: LEXAPRO Take 5 mg by mouth in the morning and at bedtime.   estradiol 0.1 MG/GM vaginal cream Commonly known as: ESTRACE Estrogen Cream Instruction Discard applicator Apply pea sized amount to tip of finger to urethra before bed. Wash hands well after application. Use Monday, Wednesday and Friday   fluticasone 50 MCG/ACT nasal spray Commonly known as: FLONASE Place 1 spray into both nostrils daily.   furosemide 40 MG tablet Commonly known as: LASIX Take 40 mg by mouth daily.   lisinopril 5 MG tablet Commonly known as: ZESTRIL Take 5 mg by mouth daily.   methocarbamol 500 MG tablet Commonly known as: ROBAXIN Take 1 tablet (500 mg total) by mouth 4 (four) times daily.   oxyCODONE 15 MG immediate release tablet Commonly known as: ROXICODONE Take 1 tablet (15 mg total) by mouth every 4 (four) hours as needed for severe pain (pain score 7-10). What changed:  medication strength how much to take   spironolactone 25 MG tablet Commonly known as: ALDACTONE Take 25 mg by mouth daily.   traZODone 100 MG tablet Commonly known as: DESYREL Take 2 tablets (200 mg total) by mouth at bedtime as needed for sleep.   venlafaxine XR 37.5 MG 24 hr capsule Commonly known as: EFFEXOR-XR Take 37.5 mg by mouth See admin instructions. Please take 1 tablet every other day for 2 weeks, then take 1 tablet every 3 days for two weeks then stop   Vitamin D 125 MCG (5000 UT) Caps Take 5,000 Units by mouth daily.        Follow-up Information     Haddix, Gillie Manners, MD. Schedule an  appointment as soon as possible for a visit in 2 week(s).   Specialty: Orthopedic Surgery Why: FOR WOUND CHECK AND REPEAT X-RAYS Contact information: [address] [place] [address] [phone]         [facility] Follow up.   Specialty: Home Health Services Why: home health services will be provided by [facility] , start of care within 48 hours post discharge Contact information: [address] [employer] [place] [address] [phone]                 Discharge Instructions and Plan: Patient will be discharged to home.  Will continue nonweightbearing of left lower extremity.  Will have patient begin working with home health physical and occupational therapy. Will be discharged on  Eliquis 2.5 mg twice daily x 30 days  for DVT prophylaxis. Patient has been provided with all the necessary DME for discharge. Patient will follow up with Dr. Jena Gauss in 2 weeks for repeat x-rays and wound check.  Signed:  Thompson Caul, PA-C ?[phone]? (phone) 01/14/2024, 11:51 AM  [facility] [address] [place] [address] [phone] (F)

## 2024-06-10 ENCOUNTER — Emergency Department

## 2024-06-10 ENCOUNTER — Other Ambulatory Visit: Payer: Self-pay

## 2024-06-10 ENCOUNTER — Emergency Department
Admission: EM | Admit: 2024-06-10 | Discharge: 2024-06-10 | Disposition: A | Discharge status: Home / Self Care | Attending: Emergency Medicine | Admitting: Emergency Medicine

## 2024-06-10 DIAGNOSIS — R21 Rash and other nonspecific skin eruption: Secondary | ICD-10-CM | POA: Diagnosis present

## 2024-06-10 DIAGNOSIS — R739 Hyperglycemia, unspecified: Secondary | ICD-10-CM | POA: Insufficient documentation

## 2024-06-10 DIAGNOSIS — R42 Dizziness and giddiness: Secondary | ICD-10-CM | POA: Insufficient documentation

## 2024-06-10 DIAGNOSIS — R0602 Shortness of breath: Secondary | ICD-10-CM | POA: Diagnosis not present

## 2024-06-10 DIAGNOSIS — R5383 Other fatigue: Secondary | ICD-10-CM | POA: Diagnosis not present

## 2024-06-10 LAB — COMPREHENSIVE METABOLIC PANEL WITH GFR
ALT: 21 U/L (ref 0–44)
AST: 28 U/L (ref 15–41)
Albumin: 4 g/dL (ref 3.5–5.0)
Alkaline Phosphatase: 75 U/L (ref 38–126)
Anion gap: 10 (ref 5–15)
BUN: 16 mg/dL (ref 6–20)
CO2: 26 mmol/L (ref 22–32)
Calcium: 9.1 mg/dL (ref 8.9–10.3)
Chloride: 102 mmol/L (ref 98–111)
Creatinine, Ser: 0.82 mg/dL (ref 0.44–1.00)
GFR, Estimated: >60 mL/min (ref >60)
Glucose, Bld: 259 mg/dL — ABNORMAL HIGH (ref 70–99)
Potassium: 3.9 mmol/L (ref 3.5–5.1)
Sodium: 138 mmol/L (ref 135–145)
Total Bilirubin: 0.6 mg/dL (ref 0.0–1.2)
Total Protein: 6.6 g/dL (ref 6.5–8.1)

## 2024-06-10 LAB — URINALYSIS, ROUTINE W REFLEX MICROSCOPIC
Bilirubin Urine: NEGATIVE
Glucose, UA: 50 mg/dL — AB
Hgb urine dipstick: NEGATIVE
Ketones, ur: NEGATIVE mg/dL
Leukocytes,Ua: NEGATIVE
Nitrite: NEGATIVE
Protein, ur: NEGATIVE mg/dL
Specific Gravity, Urine: 1.009 (ref 1.005–1.030)
pH: 5 (ref 5.0–8.0)

## 2024-06-10 LAB — CBC
HCT: 40.6 % (ref 36.0–46.0)
Hemoglobin: 13.7 g/dL (ref 12.0–15.0)
MCH: 33.7 pg (ref 26.0–34.0)
MCHC: 33.7 g/dL (ref 30.0–36.0)
MCV: 99.8 fL (ref 80.0–100.0)
Platelets: 249 10*3/uL (ref 150–400)
RBC: 4.07 MIL/uL (ref 3.87–5.11)
RDW: 14 % (ref 11.5–15.5)
WBC: 6.1 10*3/uL (ref 4.0–10.5)
nRBC: 0 % (ref 0.0–0.2)

## 2024-06-10 MED ORDER — DIPHENHYDRAMINE HCL 25 MG PO CAPS
50.0000 mg | ORAL_CAPSULE | Freq: Once | ORAL | Status: AC
  Administered 2024-06-10: 50 mg via ORAL
  Filled 2024-06-10: qty 2

## 2024-06-10 MED ORDER — ONDANSETRON HCL 4 MG/2ML IJ SOLN
4.0000 mg | Freq: Once | INTRAMUSCULAR | Status: AC
  Administered 2024-06-10: 4 mg via INTRAVENOUS
  Filled 2024-06-10: qty 2

## 2024-06-10 MED ORDER — EPINEPHRINE 0.3 MG/0.3ML IJ SOAJ: 0.3000 mg | INTRAMUSCULAR | 1 refills | Status: AC | PRN

## 2024-06-10 MED ORDER — SODIUM CHLORIDE 0.9 % IV BOLUS
1000.0000 mL | Freq: Once | INTRAVENOUS | Status: AC
  Administered 2024-06-10: 1000 mL via INTRAVENOUS

## 2024-06-10 MED ORDER — EPINEPHRINE 0.3 MG/0.3ML IJ SOAJ: 0.3000 mg | Freq: Once | INTRAMUSCULAR | Status: DC

## 2024-06-10 MED ORDER — METHYLPREDNISOLONE SODIUM SUCC 125 MG IJ SOLR
125.0000 mg | Freq: Once | INTRAMUSCULAR | Status: AC
  Administered 2024-06-10: 125 mg via INTRAVENOUS
  Filled 2024-06-10: qty 2

## 2024-06-10 MED ORDER — ACETAMINOPHEN 500 MG PO TABS
1000.0000 mg | ORAL_TABLET | Freq: Once | ORAL | Status: AC
  Administered 2024-06-10: 1000 mg via ORAL
  Filled 2024-06-10: qty 2

## 2024-06-10 MED ORDER — PREDNISONE 10 MG (21) PO TBPK
ORAL_TABLET | ORAL | 0 refills | Status: AC
Start: 2024-06-10 — End: ?

## 2024-06-10 NOTE — ED Provider Notes (Signed)
 [facility] Provider Note    Event Date/Time   First MD Initiated Contact with Patient 06/10/24 1538     (approximate)   History   Rash and Dizziness   HPI  Melinda Harrison is a 58 y.o. female who presents to the emergency department today with primary significant concern for rash.  The patient was mowing her yard and weeds 4 days ago.  She does react badly to poison ivy and similar plants.  Since then she has had a rash multiple areas of her body.  She has been trying ointments without any significant relief.  Since then she has also developed some fatigue, shortness of breath and nausea.  Patient states that in the past she has had to get shots for bad poison ivy reactions.     Physical Exam   Triage Vital Signs: ED Triage Vitals  Encounter Vitals Group     BP 06/10/24 1510 134/66     Girls Systolic BP Percentile --      Girls Diastolic BP Percentile --      Boys Systolic BP Percentile --      Boys Diastolic BP Percentile --      Pulse Rate 06/10/24 1510 (!) 114     Resp 06/10/24 1510 19     Temp 06/10/24 1510 98 F (36.7 C)     Temp src --      SpO2 06/10/24 1510 100 %     Weight 06/10/24 1508 169 lb (76.7 kg)     Height 06/10/24 1508 5' 6 (1.676 m)     Head Circumference --      Peak Flow --      Pain Score 06/10/24 1508 6     Pain Loc --      Pain Education --      Exclude from Growth Chart --     Most recent vital signs: Vitals:   06/10/24 1510  BP: 134/66  Pulse: (!) 114  Resp: 19  Temp: 98 F (36.7 C)  SpO2: 100%   General: Awake, alert, oriented. CV:  Good peripheral perfusion. Regular rate and rhythm. Resp:  Normal effort. Lungs clear. No wheezing or rhonchi. Abd:  No distention.  Other:  No lower extremity pitting edema. Areas of rash consistent with poison ivy on multiple parts of her skin.   ED Results / Procedures / Treatments   Labs (all labs ordered are listed, but only abnormal results are displayed) Labs  Reviewed  COMPREHENSIVE METABOLIC PANEL WITH GFR - Abnormal; Notable for the following components:      Result Value   Glucose, Bld 259 (*)    All other components within normal limits  URINALYSIS, ROUTINE W REFLEX MICROSCOPIC - Abnormal; Notable for the following components:   Color, Urine YELLOW (*)    APPearance CLEAR (*)    Glucose, UA 50 (*)    All other components within normal limits  CBC  CBG MONITORING, ED     EKG  I, Marylynn Soho, attending physician, personally viewed and interpreted this EKG  EKG Time: 1510 Rate: 113 Rhythm: sinus tachycardia Axis: normal Intervals: qtc 455 QRS: narrow ST changes: no st elevation Impression: abnormal ekg   RADIOLOGY I independently interpreted and visualized the CT head. My interpretation: No [facility] Radiology interpretation: IMPRESSION:  1. Stable head CT, no acute intracranial process.     PROCEDURES:  Critical Care performed: No   MEDICATIONS ORDERED IN ED: Medications - No data to  display   IMPRESSION / MDM / ASSESSMENT AND PLAN / ED COURSE  I reviewed the triage vital signs and the nursing notes.                              Differential diagnosis includes, but is not limited to, allergic reaction, dermatitis, viral exanthem, electrolyte abnormality  Patient's presentation is most consistent with acute presentation with potential threat to life or bodily function.   Patient presented to the emergency department today with primary concern for rash although she did have multiple other medical complaints.  Her exam patient does have some rash to different parts of her skin.  Did have concern for allergic reaction.  Blood work was checked.  No significant blood work or the urine abnormality.  Patient was given Benadryl  as well as steroids and did start feeling some improvement.  At this time I do think patient symptoms likely related to allergic reaction.  Of note blood work did show elevated blood glucose.  Patient  without any history of diabetes and somewhat recent A1c without concerning elevation.  Did discuss this with the patient.  Will plan on discharging patient with steroid taper but did encourage patient have follow-up with primary care to recheck sugars.      FINAL CLINICAL IMPRESSION(S) / ED DIAGNOSES   Final diagnoses:  Rash  Hyperglycemia      Note:  This document was prepared using Dragon voice recognition software and may include unintentional dictation errors.    Marylynn Soho, MD 06/10/24 [phone]

## 2024-06-10 NOTE — ED Notes (Signed)
 Dr. Derrill Kay at bedside.

## 2024-06-10 NOTE — Discharge Instructions (Signed)
 As we discussed please talk to your doctor about rechecking your sugar levels since it was high today, and the steroids can elevate the blood sugar further.

## 2024-06-10 NOTE — ED Triage Notes (Signed)
 Pt comes with c/o dizziness that started two days ago. Pt states she is also having some blurry vision that started few days ago. Pt states some sob, throat pain and ear pain that started today.  Pt also has rash over body. Pt states she was out mowing yard on Friday and after that she noticed rash.

## 2024-11-25 ENCOUNTER — Ambulatory Visit: Admitting: Urology

## 2025-01-08 ENCOUNTER — Ambulatory Visit: Admitting: Urology

## 2025-01-29 ENCOUNTER — Ambulatory Visit: Admitting: Urology

## 2025-01-29 VITALS — BP 112/73 | HR 82 | Ht 66.0 in | Wt 171.0 lb

## 2025-01-29 DIAGNOSIS — N390 Urinary tract infection, site not specified: Secondary | ICD-10-CM | POA: Diagnosis not present

## 2025-01-29 LAB — URINALYSIS, COMPLETE
Bilirubin, UA: NEGATIVE
Glucose, UA: NEGATIVE
Ketones, UA: NEGATIVE
Nitrite, UA: POSITIVE — AB
Protein,UA: NEGATIVE
Specific Gravity, UA: 1.025 (ref 1.005–1.030)
Urobilinogen, Ur: 1 mg/dL (ref 0.2–1.0)
pH, UA: 6 (ref 5.0–7.5)

## 2025-01-29 LAB — MICROSCOPIC EXAMINATION
Epithelial Cells (non renal): >10 /HPF — AB (ref 0–10)
WBC, UA: >30 /HPF — AB (ref 0–5)

## 2025-01-29 MED ORDER — ESTRADIOL 0.01 % VA CREA: TOPICAL_CREAM | VAGINAL | 12 refills | Status: AC

## 2025-01-29 MED ORDER — NITROFURANTOIN MONOHYD MACRO 100 MG PO CAPS: 100.0000 mg | ORAL_CAPSULE | Freq: Every day | ORAL | 0 refills | Status: AC

## 2025-01-29 NOTE — Addendum Note (Signed)
 Addended by: ELOUISE SANTA BROCKS on: 01/29/2025 11:52 AM   Modules accepted: Orders

## 2025-01-29 NOTE — Progress Notes (Signed)
 "  01/29/25 11:12 AM   Melinda Harrison December 11, 1966 [id number]  CC: Recurrent UTI  HPI: Seen in October 2022 by me for similar issues.  Primary UTI symptoms are pelvic pressure/discomfort and foul-smelling urine.  She has some baseline urgency and frequency, no significant incontinence.  She never followed up after being started on topical estrogen cream, cranberry tablets, 90 days low-dose antibiotic prophylaxis.  Multiple positive cultures for E. coli over the last few months in Care Everywhere.  CT abdomen and pelvis January 2025 with no urologic abnormalities.  She denies any gross hematuria.  Primary issue is foul-smelling urine   PMH: Past Medical History:  Diagnosis Date   Anemia    H/O   Anxiety    Arthritis    Asthma    Back pain    COPD (chronic obstructive pulmonary disease) (HCC)    Depression    GERD (gastroesophageal reflux disease)    H/O PRIOR TO GASTRIC BYPASS   Headache    History of DVT (deep vein thrombosis)    Occurred in 2017   History of hiatal hernia    H/O   History of kidney stones    H/O   Hypertension    H/O -NO MEDS SINCE HAVING GASTRIC BYPASS   IBS (irritable bowel syndrome)    PAD (peripheral artery disease)    PTSD (post-traumatic stress disorder)    PTSD (post-traumatic stress disorder)     Surgical History: Past Surgical History:  Procedure Laterality Date   CHOLECYSTECTOMY     DILATION AND CURETTAGE OF UTERUS     GASTRIC ROUX-EN-Y N/A 11/23/2015   Procedure: LAPAROSCOPIC ROUX-EN-Y GASTRIC BYPASS, HIATAL HERNIA REPAIR;  Surgeon: Ozell DELENA Mano, MD;  Location: [facility] ORS;  Service: General;  Laterality: N/A;   MENISCUS REPAIR Left 04/23/2023   Procedure: REPAIR OF MENISCUS;  Surgeon: Kendal Franky SQUIBB, MD;  Location: [facility] OR;  Service: Orthopedics;  Laterality: Left;   NECK SURGERY     ORIF FEMUR FRACTURE Left 01/07/2024   Procedure: OPEN REDUCTION INTERNAL FIXATION (ORIF) DISTAL FEMUR FRACTURE;  Surgeon: Kendal Franky SQUIBB, MD;  Location: [facility]  OR;  Service: Orthopedics;  Laterality: Left;   ORIF TIBIA PLATEAU Left 04/23/2023   Procedure: OPEN REDUCTION INTERNAL FIXATION (ORIF) TIBIAL PLATEAU;  Surgeon: Kendal Franky SQUIBB, MD;  Location: [facility] OR;  Service: Orthopedics;  Laterality: Left;   TONSILLECTOMY     Family History: Family History  Problem Relation Age of Onset   Hypertension Mother    Thyroid disease Mother    Heart disease Mother    Heart failure Mother    Deep vein thrombosis Father    Hypertension Father    Depression Brother    Hypertension Brother    Depression Brother    Hypertension Brother     Social History:  reports that she has never smoked. She has never used smokeless tobacco. She reports current alcohol use. She reports that she does not use drugs.  Physical Exam: BP 112/73 (BP Location: Left Arm, Patient Position: Sitting, Cuff Size: Normal)   Pulse 82   Ht 5' 6 (1.676 m)   Wt 171 lb (77.6 kg)   LMP 03/09/2016   SpO2 99%   BMI 27.60 kg/m    Constitutional:  Alert and oriented, No acute distress. Cardiovascular: No clubbing, cyanosis, or edema. Respiratory: Normal respiratory effort, no increased work of breathing. GI: Abdomen is soft, nontender, nondistended, no abdominal masses   Laboratory Data: Culture data reviewed in care everywhere  Pertinent  Imaging: I have personally viewed and interpreted the CT chest abdomen and pelvis from January 2025 with no urologic abnormalities, no hydronephrosis, no stones.  Assessment & Plan:   59 year old female with multiple positive urine cultures, challenging to determine if this is asymptomatic bacteriuria with a bothersome odor, versus true UTI.  Normal CT from earlier this year.  We discussed the evaluation and treatment of patients with recurrent UTIs at length.  We specifically discussed the differences between asymptomatic bacteriuria and true urinary tract infection.  We discussed the AUA definition of recurrent UTI of at least 2 culture proven  symptomatic acute cystitis episodes in a 15-month period, or 3 within a 1 year period.  We discussed the importance of culture directed antibiotic treatment, and antibiotic stewardship.  First-line therapy includes nitrofurantoin (5 days), Bactrim(3 days), or fosfomycin(3 g single dose).  Possible etiologies of recurrent infection include periurethral tissue atrophy in postmenopausal woman, constipation, sexual activity, incomplete emptying, anatomic abnormalities, and even genetic predisposition.  Finally, we discussed the role of perineal hygiene, timed voiding, adequate hydration, topical vaginal estrogen, cranberry prophylaxis, and low-dose antibiotic prophylaxis.  Topical estrogen cream, cranberry tablets, 90 days nitrofurantoin  prophylaxis RTC PA 4 months Can consider cystoscopy in the future, but suspect will be low yield  Redell Burnet, MD 01/29/2025  [facility] [address] [place], [place] [address] [phone]   "

## 2025-01-29 NOTE — Patient Instructions (Signed)
 Estrogen Cream Instructions  Discard applicator  Apply pea sized amount to tip of finger to urethra before bed every night for 1 week the use Monday, Wednesday and Friday. Wash hands well after application.

## 2025-01-30 LAB — MYCOPLASMA / UREAPLASMA HOMINIS CULTURE

## 2025-02-10 ENCOUNTER — Ambulatory Visit: Admit: 2025-02-10 | Admitting: Gastroenterology

## 2025-02-10 SURGERY — COLONOSCOPY
Anesthesia: General

## 2025-05-29 ENCOUNTER — Ambulatory Visit: Admitting: Urology
